# Patient Record
Sex: Male | Born: 1980 | Race: White | Hispanic: No | Marital: Single | Smoking: Former smoker
Health system: Southern US, Community
[De-identification: ages and names within clinical notes are randomized; demographics above are authoritative.]

## PROBLEM LIST (undated history)

## (undated) DIAGNOSIS — F32A Depression, unspecified: Secondary | ICD-10-CM

## (undated) DIAGNOSIS — F419 Anxiety disorder, unspecified: Secondary | ICD-10-CM

## (undated) DIAGNOSIS — E119 Type 2 diabetes mellitus without complications: Secondary | ICD-10-CM

## (undated) DIAGNOSIS — F329 Major depressive disorder, single episode, unspecified: Secondary | ICD-10-CM

## (undated) HISTORY — PX: APPENDECTOMY: SHX54

---

## 2015-05-14 DIAGNOSIS — E119 Type 2 diabetes mellitus without complications: Secondary | ICD-10-CM

## 2015-05-14 HISTORY — DX: Type 2 diabetes mellitus without complications: E11.9

## 2015-05-29 ENCOUNTER — Emergency Department (HOSPITAL_BASED_OUTPATIENT_CLINIC_OR_DEPARTMENT_OTHER): Payer: BC Managed Care – PPO

## 2015-05-29 ENCOUNTER — Other Ambulatory Visit: Payer: Self-pay

## 2015-05-29 ENCOUNTER — Inpatient Hospital Stay (HOSPITAL_BASED_OUTPATIENT_CLINIC_OR_DEPARTMENT_OTHER)
Admission: EM | Admit: 2015-05-29 | Discharge: 2015-05-30 | DRG: 638 | Disposition: A | Discharge status: Home / Self Care | Payer: BC Managed Care – PPO | Attending: Internal Medicine | Admitting: Internal Medicine

## 2015-05-29 ENCOUNTER — Encounter (HOSPITAL_BASED_OUTPATIENT_CLINIC_OR_DEPARTMENT_OTHER): Payer: Self-pay

## 2015-05-29 DIAGNOSIS — E1101 Type 2 diabetes mellitus with hyperosmolarity with coma: Secondary | ICD-10-CM | POA: Diagnosis present

## 2015-05-29 DIAGNOSIS — E7251 Non-ketotic hyperglycinemia: Secondary | ICD-10-CM | POA: Diagnosis present

## 2015-05-29 DIAGNOSIS — Z6821 Body mass index (BMI) 21.0-21.9, adult: Secondary | ICD-10-CM

## 2015-05-29 DIAGNOSIS — F17210 Nicotine dependence, cigarettes, uncomplicated: Secondary | ICD-10-CM | POA: Diagnosis present

## 2015-05-29 DIAGNOSIS — E1165 Type 2 diabetes mellitus with hyperglycemia: Secondary | ICD-10-CM | POA: Diagnosis present

## 2015-05-29 DIAGNOSIS — E161 Other hypoglycemia: Secondary | ICD-10-CM | POA: Diagnosis present

## 2015-05-29 DIAGNOSIS — E871 Hypo-osmolality and hyponatremia: Secondary | ICD-10-CM | POA: Diagnosis present

## 2015-05-29 DIAGNOSIS — E8889 Other specified metabolic disorders: Secondary | ICD-10-CM | POA: Diagnosis present

## 2015-05-29 DIAGNOSIS — E162 Hypoglycemia, unspecified: Secondary | ICD-10-CM | POA: Diagnosis present

## 2015-05-29 DIAGNOSIS — R739 Hyperglycemia, unspecified: Secondary | ICD-10-CM

## 2015-05-29 HISTORY — DX: Anxiety disorder, unspecified: F41.9

## 2015-05-29 HISTORY — DX: Depression, unspecified: F32.A

## 2015-05-29 HISTORY — DX: Type 2 diabetes mellitus without complications: E11.9

## 2015-05-29 HISTORY — DX: Major depressive disorder, single episode, unspecified: F32.9

## 2015-05-29 LAB — CBC
HEMATOCRIT: 42.5 % (ref 39.0–52.0)
HEMATOCRIT: 37 % — AB (ref 39.0–52.0)
HEMOGLOBIN: 15.2 g/dL (ref 13.0–17.0)
Hemoglobin: 13.5 g/dL (ref 13.0–17.0)
MCH: 32.3 pg (ref 26.0–34.0)
MCH: 33 pg (ref 26.0–34.0)
MCHC: 35.8 g/dL (ref 30.0–36.0)
MCHC: 36.5 g/dL — AB (ref 30.0–36.0)
MCV: 90.4 fL (ref 78.0–100.0)
MCV: 90.5 fL (ref 78.0–100.0)
Platelets: 189 10*3/uL (ref 150–400)
Platelets: 174 10*3/uL (ref 150–400)
RBC: 4.7 MIL/uL (ref 4.22–5.81)
RBC: 4.09 MIL/uL — ABNORMAL LOW (ref 4.22–5.81)
RDW: 11.6 % (ref 11.5–15.5)
RDW: 11.8 % (ref 11.5–15.5)
WBC: 4.7 10*3/uL (ref 4.0–10.5)
WBC: 4.1 10*3/uL (ref 4.0–10.5)

## 2015-05-29 LAB — URINE MICROSCOPIC-ADD ON
Bacteria, UA: NONE SEEN
SQUAMOUS EPITHELIAL / LPF: NONE SEEN
WBC UA: NONE SEEN WBC/hpf (ref 0–5)

## 2015-05-29 LAB — GLUCOSE, CAPILLARY
GLUCOSE-CAPILLARY: 159 mg/dL — AB (ref 65–99)
GLUCOSE-CAPILLARY: 309 mg/dL — AB (ref 65–99)
Glucose-Capillary: 144 mg/dL — ABNORMAL HIGH (ref 65–99)
Glucose-Capillary: 304 mg/dL — ABNORMAL HIGH (ref 65–99)

## 2015-05-29 LAB — BASIC METABOLIC PANEL
ANION GAP: 14 (ref 5–15)
Anion gap: 10 (ref 5–15)
BUN: 9 mg/dL (ref 6–20)
BUN: 7 mg/dL (ref 6–20)
CALCIUM: 8.6 mg/dL — AB (ref 8.9–10.3)
CHLORIDE: 93 mmol/L — AB (ref 101–111)
CO2: 22 mmol/L (ref 22–32)
CO2: 25 mmol/L (ref 22–32)
CREATININE: 0.62 mg/dL (ref 0.61–1.24)
Calcium: 9.5 mg/dL (ref 8.9–10.3)
Chloride: 104 mmol/L (ref 101–111)
Creatinine, Ser: 0.67 mg/dL (ref 0.61–1.24)
GFR calc non Af Amer: >60 mL/min (ref >60)
Glucose, Bld: 542 mg/dL — ABNORMAL HIGH (ref 65–99)
Glucose, Bld: 169 mg/dL — ABNORMAL HIGH (ref 65–99)
POTASSIUM: 4.1 mmol/L (ref 3.5–5.1)
Potassium: 3.2 mmol/L — ABNORMAL LOW (ref 3.5–5.1)
SODIUM: 129 mmol/L — AB (ref 135–145)
Sodium: 139 mmol/L (ref 135–145)

## 2015-05-29 LAB — CBG MONITORING, ED
GLUCOSE-CAPILLARY: 283 mg/dL — AB (ref 65–99)
GLUCOSE-CAPILLARY: 246 mg/dL — AB (ref 65–99)
Glucose-Capillary: 561 mg/dL (ref 65–99)
Glucose-Capillary: 298 mg/dL — ABNORMAL HIGH (ref 65–99)

## 2015-05-29 LAB — URINALYSIS, ROUTINE W REFLEX MICROSCOPIC
BILIRUBIN URINE: NEGATIVE
Glucose, UA: >1000 mg/dL — AB
Hgb urine dipstick: NEGATIVE
KETONES UR: 15 mg/dL — AB
LEUKOCYTES UA: NEGATIVE
NITRITE: NEGATIVE
Protein, ur: NEGATIVE mg/dL
SPECIFIC GRAVITY, URINE: 1.038 — AB (ref 1.005–1.030)
pH: 6 (ref 5.0–8.0)

## 2015-05-29 LAB — TROPONIN I: Troponin I: <0.03 ng/mL (ref <0.031)

## 2015-05-29 MED ORDER — PNEUMOCOCCAL VAC POLYVALENT 25 MCG/0.5ML IJ INJ
0.5000 mL | INJECTION | INTRAMUSCULAR | Status: AC
Start: 2015-05-30 — End: 2015-05-30
  Administered 2015-05-30: 0.5 mL via INTRAMUSCULAR
  Filled 2015-05-29: qty 0.5

## 2015-05-29 MED ORDER — SODIUM CHLORIDE 0.9 % IV SOLN: INTRAVENOUS | Status: DC

## 2015-05-29 MED ORDER — INFLUENZA VAC SPLIT QUAD 0.5 ML IM SUSY
0.5000 mL | PREFILLED_SYRINGE | INTRAMUSCULAR | Status: AC
  Administered 2015-05-30: 0.5 mL via INTRAMUSCULAR
  Filled 2015-05-29: qty 0.5

## 2015-05-29 MED ORDER — ONDANSETRON HCL 4 MG/2ML IJ SOLN: 4.0000 mg | Freq: Four times a day (QID) | INTRAMUSCULAR | Status: DC | PRN

## 2015-05-29 MED ORDER — SODIUM CHLORIDE 0.9 % IV BOLUS (SEPSIS): 1000.0000 mL | Freq: Once | INTRAVENOUS | Status: DC

## 2015-05-29 MED ORDER — SODIUM CHLORIDE 0.9 % IV SOLN
INTRAVENOUS | Status: AC
  Administered 2015-05-29: 2 [IU]/h via INTRAVENOUS
  Administered 2015-05-29: 1.7 [IU]/h via INTRAVENOUS
  Administered 2015-05-29: 3.7 [IU]/h via INTRAVENOUS
  Administered 2015-05-29: 5 [IU]/h via INTRAVENOUS
  Filled 2015-05-29: qty 2.5

## 2015-05-29 MED ORDER — INSULIN ASPART 100 UNIT/ML ~~LOC~~ SOLN
3.0000 [IU] | Freq: Three times a day (TID) | SUBCUTANEOUS | Status: DC
Start: 2015-05-30 — End: 2015-05-30
  Administered 2015-05-30 (×2): 3 [IU] via SUBCUTANEOUS

## 2015-05-29 MED ORDER — HEPARIN SODIUM (PORCINE) 5000 UNIT/ML IJ SOLN
5000.0000 [IU] | Freq: Three times a day (TID) | INTRAMUSCULAR | Status: DC
  Administered 2015-05-29 – 2015-05-30 (×2): 5000 [IU] via SUBCUTANEOUS
  Filled 2015-05-29 (×2): qty 1

## 2015-05-29 MED ORDER — DEXTROSE-NACL 5-0.9 % IV SOLN: Freq: Once | INTRAVENOUS | Status: DC

## 2015-05-29 MED ORDER — ENSURE ENLIVE PO LIQD
237.0000 mL | Freq: Two times a day (BID) | ORAL | Status: DC
  Administered 2015-05-30: 237 mL via ORAL

## 2015-05-29 MED ORDER — SODIUM CHLORIDE 0.9 % IV BOLUS (SEPSIS)
2000.0000 mL | Freq: Once | INTRAVENOUS | Status: AC
  Administered 2015-05-29: 2000 mL via INTRAVENOUS

## 2015-05-29 MED ORDER — INSULIN DETEMIR 100 UNIT/ML ~~LOC~~ SOLN
10.0000 [IU] | Freq: Every day | SUBCUTANEOUS | Status: DC
Start: 2015-05-29 — End: 2015-05-30
  Administered 2015-05-29: 10 [IU] via SUBCUTANEOUS
  Filled 2015-05-29: qty 0.1

## 2015-05-29 MED ORDER — DEXTROSE-NACL 5-0.45 % IV SOLN
INTRAVENOUS | Status: DC
  Administered 2015-05-29: 15:00:00 via INTRAVENOUS

## 2015-05-29 MED ORDER — SODIUM CHLORIDE 0.9 % IV BOLUS (SEPSIS)
1000.0000 mL | Freq: Once | INTRAVENOUS | Status: AC
  Administered 2015-05-29: 1000 mL via INTRAVENOUS

## 2015-05-29 MED ORDER — ACETAMINOPHEN 325 MG PO TABS
650.0000 mg | ORAL_TABLET | Freq: Four times a day (QID) | ORAL | Status: DC | PRN
  Filled 2015-05-29: qty 2

## 2015-05-29 MED ORDER — ACETAMINOPHEN 650 MG RE SUPP: 650.0000 mg | Freq: Four times a day (QID) | RECTAL | Status: DC | PRN

## 2015-05-29 MED ORDER — INSULIN DETEMIR 100 UNIT/ML ~~LOC~~ SOLN
20.0000 [IU] | Freq: Every day | SUBCUTANEOUS | Status: DC
Start: 2015-05-29 — End: 2015-05-29
  Filled 2015-05-29: qty 0.2

## 2015-05-29 MED ORDER — INSULIN ASPART 100 UNIT/ML ~~LOC~~ SOLN
0.0000 [IU] | Freq: Three times a day (TID) | SUBCUTANEOUS | Status: DC
  Administered 2015-05-30: 3 [IU] via SUBCUTANEOUS
  Administered 2015-05-30: 7 [IU] via SUBCUTANEOUS

## 2015-05-29 MED ORDER — ONDANSETRON HCL 4 MG PO TABS: 4.0000 mg | ORAL_TABLET | Freq: Four times a day (QID) | ORAL | Status: DC | PRN

## 2015-05-29 MED ORDER — INSULIN ASPART 100 UNIT/ML ~~LOC~~ SOLN
0.0000 [IU] | Freq: Every day | SUBCUTANEOUS | Status: DC
  Administered 2015-05-29: 4 [IU] via SUBCUTANEOUS

## 2015-05-29 NOTE — ED Notes (Signed)
Presents with mid sternal, non-radiating chest pain, described as pressure, onset a few days ago, states pain is intermittent

## 2015-05-29 NOTE — ED Notes (Signed)
Pa  at bedside. 

## 2015-05-29 NOTE — ED Notes (Signed)
1 liter NS fluid bolus initiated

## 2015-05-29 NOTE — Progress Notes (Signed)
Greg Goodman [id number] Admission Data: 05/29/2015 5:51 PM Attending Provider: Marinda Elk, MD  PCP:No PCP Per Patient Consults/ Treatment Team:    Greg Goodman is a 35 y.o. male patient admitted from [facility] awake, alert  & orientated  X 3,  Full Code, VSS - Blood pressure 133/89, pulse 86, temperature 98.7 F (37.1 C), temperature source Oral, resp. rate 18, height  (1.854 m), weight 68.4 kg (150 lb 12.7 oz), SpO2 99 %.,   IV site WDL:  Running multiple fluids.   Allergies:  No Known Allergies   History reviewed. No pertinent past medical history.   Pt orientation to unit, room and routine. Information packet given to patient/family and safety video watched.  Admission INP armband ID verified with patient/family, and in place. SR up x 2, fall risk assessment complete with Patient and family verbalizing understanding of risks associated with falls. Pt verbalizes an understanding of how to use the call bell and to call for help before getting out of bed.  Skin, clean-dry- intact without evidence of bruising, or skin tears.   No evidence of skin break down noted on exam.     Will cont to monitor and assist as needed.  Kern Reap, RN 05/29/2015 5:51 PM

## 2015-05-29 NOTE — ED Provider Notes (Signed)
CSN: [id number]     Arrival date & time 05/29/15  1214 History   First MD Initiated Contact with Patient 05/29/15 1321     Chief Complaint  Patient presents with  . Chest Pain     (Consider location/radiation/quality/duration/timing/severity/associated sxs/prior Treatment) HPI  Greg Goodman Is a 35 year old male who presents the emergency department from an urgent care facility for hyperglycemia. The patient presented to the urgent care earlier because of central chest discomfort feeling like "something just wasn't right."  The patient has had intermittent feelings of retrosternal pressure for the past several months. He states it can last minutes to up to an hour. He has some occasional shortness of breath. It doesn't seem to be exertional or positional. He denies chest pain, symptoms of reflux, nausea or vomiting. The patient has had polyuria, polydipsia and polyphagia ongoing for almost a year. He has had an unintentional weight loss of more than 30 pounds. Patient did see a primary care physician. Motor a year ago who told him he was prediabetic. At that time. Today it patient presented with a blood sugar of almost 600. He is complaining of intermittent pruritic tenderness, which she attributed to his cirrhosis during the winter. Cough, pnd, or orthopnea  History reviewed. No pertinent past medical history. Past Surgical History  Procedure Laterality Date  . Appendectomy     No family history on file. Social History  Substance Use Topics  . Smoking status: Current Every Day Smoker  . Smokeless tobacco: None  . Alcohol Use: Yes     Comment: occ    Review of Systems  Ten systems reviewed and are negative for acute change, except as noted in the HPI.    Allergies  Review of patient's allergies indicates no known allergies.  Home Medications   Prior to Admission medications   Not on File   BP 144/98 mmHg  Pulse 108  Temp(Src) 98 F (36.7 C) (Oral)  Resp 16  Ht   (1.854 m)  Wt 72.576 kg  BMI 21.11 kg/m2  SpO2 100% Physical Exam  Constitutional: He appears well-developed and well-nourished. No distress.  HENT:  Head: Normocephalic and atraumatic.  xerostomia  Eyes: Conjunctivae are normal. No scleral icterus.  Neck: Normal range of motion. Neck supple.  Cardiovascular: Normal rate, regular rhythm and normal heart sounds.   Pulmonary/Chest: Effort normal and breath sounds normal. No respiratory distress.  Abdominal: Soft. There is no tenderness.  Musculoskeletal: He exhibits no edema.  Neurological: He is alert.  Skin: Skin is warm and dry. He is not diaphoretic.  Poor skin turgor  Psychiatric: His behavior is normal.  Nursing note and vitals reviewed.   ED Course  Procedures (including critical care time) Labs Review Labs Reviewed  BASIC METABOLIC PANEL - Abnormal; Notable for the following:    Sodium 129 (*)    Chloride 93 (*)    Glucose, Bld 542 (*)    All other components within normal limits  URINALYSIS, ROUTINE W REFLEX MICROSCOPIC (NOT AT [facility]) - Abnormal; Notable for the following:    Specific Gravity, Urine 1.038 (*)    Glucose, UA >1000 (*)    Ketones, ur 15 (*)    All other components within normal limits  CBG MONITORING, ED - Abnormal; Notable for the following:    Glucose-Capillary 561 (*)    All other components within normal limits  CBC  TROPONIN I  URINE MICROSCOPIC-ADD ON    Imaging Review Dg Chest 2 View  05/29/2015  CLINICAL DATA:  Intermittent chest pain for 2 days, elevated blood sugar, smoker EXAM: CHEST  2 VIEW COMPARISON:  None FINDINGS: Normal heart size, mediastinal contours, and pulmonary vascularity. Lungs clear. No pneumothorax. Bones unremarkable. IMPRESSION: Normal exam. Electronically Signed   By: Ulyses Southward M.D.   On: 05/29/2015 13:36   I have personally reviewed and evaluated these images and lab results as part of my medical decision-making.   EKG Interpretation None      MDM   Final  diagnoses:  Non-ketotic hypoglycemia      4:04 PM BP 135/96 mmHg  Pulse 92  Temp(Src) 98 F (36.7 C) (Oral)  Resp 19  Ht  (1.854 m)  Wt 72.576 kg  BMI 21.11 kg/m2  SpO2 100% Patient with new onset diabetes. He is on insulin drip with drastic reduction in his blood sugars. I have also ordered D5 and half saline. I spoke with Dr. Zenaida Niece, who will admit the patient. I also spoke with the diabetic coordinator who will see the patient. Decision to admit. Based on the fact the patient does not have a primary care physician and is a new diabetic. He is safe for his course here in the emergency department.    Arthor Captain, PA-C 05/29/15 1718  Geoffery Lyons, MD 06/01/15 [phone]

## 2015-05-29 NOTE — Progress Notes (Signed)
Patient found to have new onset DM-- has no PCP and no diabetic education.  Does have insurance.  Will obs to find the right diabetic medications and get education with diabetic coordinator  Marlin Canary DO

## 2015-05-29 NOTE — ED Notes (Signed)
Placed on cont cardiac monitoring 

## 2015-05-29 NOTE — ED Notes (Signed)
CP x 3- 4 days-was seen at urgent care today and advised BS "HIGH"

## 2015-05-29 NOTE — ED Notes (Signed)
Patient transported to X-ray 

## 2015-05-29 NOTE — H&P (Signed)
[facility] History and Physical  Roque Schill KZS:[id number] DOB: 1981/03/24 DOA: 05/29/2015  Referring physician: Dr. Eliseo Squires PCP: No PCP Per Patient   Chief Complaint: chest discomfort  HPI: Greg Goodman is a 35 y.o. male with no significant past medical history and severe June care for not feeling well blood glucose was done as that time and he was found to be hyperglycemic. He relates he's had intermittent chest pain for the last several weeks the last about an hour and then go away. He denies any exertional dyspnea or chest pain non-positional chest pain. He relates he's had polyuria and polydipsia for last several months, with unintentional weight loss he relates more than 30 pounds in the last 6 months. On the visit to urgent care his blood glucose was 600 so he was referred for admission. He has no new medication so culture sores no large consumptions of alcohol or recreational drugs.  In the ED: Is found to have with a blood look O's of 561, mildly hyponatremic no leukocytosis first of cardiac enzymes negative an EKG as below. He had urine ketones.  Review of Systems:  Constitutional:   night sweats, Fevers, chills, fatigue.  HEENT:  No headaches, Difficulty swallowing,Tooth/dental problems,Sore throat,  No sneezing, itching, ear ache, nasal congestion, post nasal drip,  Cardio-vascular:  No chest pain, Orthopnea, PND, swelling in lower extremities, anasarca, dizziness, palpitations  GI:  No heartburn, indigestion, abdominal pain, nausea, vomiting, diarrhea, change in bowel habits, loss of appetite  Resp:  No shortness of breath with exertion or at rest. No excess mucus, no productive cough, No non-productive cough, No coughing up of blood.No change in color of mucus.No wheezing.No chest wall deformity  Skin:  no rash or lesions.  GU:  no dysuria, change in color of urine, no urgency or frequency. No flank pain.  Musculoskeletal:  No joint pain or swelling. No decreased  range of motion. No back pain.  Psych:  No change in mood or affect. No depression or anxiety. No memory loss.   History reviewed. No pertinent past medical history. Past Surgical History  Procedure Laterality Date  . Appendectomy     Social History:  reports that he has been smoking Cigarettes.  He has been smoking about 0.25 packs per day. He does not have any smokeless tobacco history on file. He reports that he drinks alcohol. He reports that he does not use illicit drugs.  No Known Allergies  History reviewed. No pertinent family history.  He relates that his mother or father have no medical condition.  Prior to Admission medications   Not on File   Physical Exam: Filed Vitals:   05/29/15 1500 05/29/15 1515 05/29/15 1524 05/29/15 1659  BP:   135/96 133/89  Pulse: 85 86 92 86  Temp:    98.7 F (37.1 C)  TempSrc:    Oral  Resp: '18 14 19 18  ' Height:    '6\' 1"'  (1.854 m)  Weight:    68.4 kg (150 lb 12.7 oz)  SpO2: 100% 100% 100% 99%    Wt Readings from Last 3 Encounters:  05/29/15 68.4 kg (150 lb 12.7 oz)    General:  Appears calm and comfortable Eyes: PERRL, normal lids, irises & conjunctiva ENT: grossly normal hearing, lips & tongue Neck: no LAD, masses or thyromegaly Cardiovascular: RRR, no m/r/g. No LE edema. Telemetry: SR, no arrhythmias  Respiratory: CTA bilaterally, no w/r/r. Normal respiratory effort. Abdomen: soft, ntnd Skin: no rash or induration seen on limited  exam Musculoskeletal: grossly normal tone BUE/BLE Psychiatric: grossly normal mood and affect, speech fluent and appropriate Neurologic: grossly non-focal.          Labs on Admission:  Basic Metabolic Panel:  Recent Labs Lab 05/29/15 1250  NA 129*  K 4.1  CL 93*  CO2 22  GLUCOSE 542*  BUN 9  CREATININE 0.67  CALCIUM 9.5   Liver Function Tests: No results for input(s): AST, ALT, ALKPHOS, BILITOT, PROT, ALBUMIN in the last 168 hours. No results for input(s): LIPASE, AMYLASE in the  last 168 hours. No results for input(s): AMMONIA in the last 168 hours. CBC:  Recent Labs Lab 05/29/15 1250  WBC 4.7  HGB 15.2  HCT 42.5  MCV 90.4  PLT 189   Cardiac Enzymes:  Recent Labs Lab 05/29/15 1250  TROPONINI <0.03    BNP (last 3 results) No results for input(s): BNP in the last 8760 hours.  ProBNP (last 3 results) No results for input(s): PROBNP in the last 8760 hours.  CBG:  Recent Labs Lab 05/29/15 1229 05/29/15 1441 05/29/15 1511 05/29/15 1538  GLUCAP 561* 298* 283* 246*    Radiological Exams on Admission: Dg Chest 2 View  05/29/2015  CLINICAL DATA:  Intermittent chest pain for 2 days, elevated blood sugar, smoker EXAM: CHEST  2 VIEW COMPARISON:  None FINDINGS: Normal heart size, mediastinal contours, and pulmonary vascularity. Lungs clear. No pneumothorax. Bones unremarkable. IMPRESSION: Normal exam. Electronically Signed   By: Lavonia Dana M.D.   On: 05/29/2015 13:36    EKG: Independently reviewed. Normal sinus or rhythm intervals were normal axis is normal no T wave changes.  Assessment/Plan Active Problems:   Nonketotic hyperglycinemia (HCC)   Hyponatremia  I agree with IV insulin, his blood glucose is now less than 200, we'll start him on subcutaneous insulin for sliding scale insulin. Check a hemoglobin A1c and an anti-GAD antibody. We'll given to liter bolus additionally of normal saline. Allow  diet. Check a b-met now and in am. Cont CBG's ACHS. Likely newly diagnosed diabetic, check a UDS he relates he doesn't drink alcohol check LFTs. No new drugs or steroids.   Code Status: full DVT Prophylaxis:heparin Family Communication: none Disposition Plan: inpatient  Time spent: 65 min  Charlynne Cousins [facility] Pager [phone]

## 2015-05-30 DIAGNOSIS — E7251 Non-ketotic hyperglycinemia: Secondary | ICD-10-CM

## 2015-05-30 LAB — CBC
HCT: 37.5 % — ABNORMAL LOW (ref 39.0–52.0)
HEMOGLOBIN: 13.5 g/dL (ref 13.0–17.0)
MCH: 33.3 pg (ref 26.0–34.0)
MCHC: 36 g/dL (ref 30.0–36.0)
MCV: 92.6 fL (ref 78.0–100.0)
Platelets: 152 10*3/uL (ref 150–400)
RBC: 4.05 MIL/uL — ABNORMAL LOW (ref 4.22–5.81)
RDW: 11.9 % (ref 11.5–15.5)
WBC: 3.3 10*3/uL — ABNORMAL LOW (ref 4.0–10.5)

## 2015-05-30 LAB — COMPREHENSIVE METABOLIC PANEL
ALBUMIN: 3.3 g/dL — AB (ref 3.5–5.0)
ALK PHOS: 55 U/L (ref 38–126)
ALT: 21 U/L (ref 17–63)
ANION GAP: 8 (ref 5–15)
AST: 24 U/L (ref 15–41)
BILIRUBIN TOTAL: 0.4 mg/dL (ref 0.3–1.2)
BUN: 6 mg/dL (ref 6–20)
CALCIUM: 8.5 mg/dL — AB (ref 8.9–10.3)
CO2: 23 mmol/L (ref 22–32)
Chloride: 104 mmol/L (ref 101–111)
Creatinine, Ser: 0.66 mg/dL (ref 0.61–1.24)
GFR calc Af Amer: >60 mL/min (ref >60)
GFR calc non Af Amer: >60 mL/min (ref >60)
Glucose, Bld: 262 mg/dL — ABNORMAL HIGH (ref 65–99)
Potassium: 3.9 mmol/L (ref 3.5–5.1)
SODIUM: 135 mmol/L (ref 135–145)
TOTAL PROTEIN: 5.3 g/dL — AB (ref 6.5–8.1)

## 2015-05-30 LAB — GLUTAMIC ACID DECARBOXYLASE AUTO ABS: Glutamic Acid Decarb Ab: <5 U/mL (ref 0.0–5.0)

## 2015-05-30 LAB — HEMOGLOBIN A1C
HEMOGLOBIN A1C: 13.3 % — AB (ref 4.8–5.6)
MEAN PLASMA GLUCOSE: 335 mg/dL

## 2015-05-30 LAB — GLUCOSE, CAPILLARY
Glucose-Capillary: 258 mg/dL — ABNORMAL HIGH (ref 65–99)
Glucose-Capillary: 225 mg/dL — ABNORMAL HIGH (ref 65–99)
Glucose-Capillary: 352 mg/dL — ABNORMAL HIGH (ref 65–99)

## 2015-05-30 MED ORDER — POTASSIUM CHLORIDE CRYS ER 20 MEQ PO TBCR
40.0000 meq | EXTENDED_RELEASE_TABLET | Freq: Two times a day (BID) | ORAL | Status: DC
  Administered 2015-05-30: 40 meq via ORAL
  Filled 2015-05-30: qty 2

## 2015-05-30 MED ORDER — INSULIN DETEMIR 100 UNIT/ML FLEXPEN: 30.0000 [IU] | PEN_INJECTOR | Freq: Every day | SUBCUTANEOUS | Status: DC

## 2015-05-30 MED ORDER — INSULIN DETEMIR 100 UNIT/ML ~~LOC~~ SOLN
15.0000 [IU] | Freq: Two times a day (BID) | SUBCUTANEOUS | Status: DC
  Administered 2015-05-30: 15 [IU] via SUBCUTANEOUS
  Filled 2015-05-30 (×2): qty 0.15

## 2015-05-30 MED ORDER — INSULIN PEN NEEDLE 31G X 6 MM MISC: 1.0000 | Freq: Two times a day (BID) | Status: DC

## 2015-05-30 MED ORDER — INSULIN ASPART 100 UNIT/ML FLEXPEN: 4.0000 [IU] | PEN_INJECTOR | Freq: Three times a day (TID) | SUBCUTANEOUS | Status: DC

## 2015-05-30 MED ORDER — INSULIN ASPART 100 UNIT/ML ~~LOC~~ SOLN
4.0000 [IU] | Freq: Once | SUBCUTANEOUS | Status: AC
  Administered 2015-05-30: 4 [IU] via SUBCUTANEOUS

## 2015-05-30 MED ORDER — INSULIN DETEMIR 100 UNIT/ML FLEXPEN: 40.0000 [IU] | PEN_INJECTOR | Freq: Every day | SUBCUTANEOUS | Status: DC

## 2015-05-30 MED ORDER — LIVING WELL WITH DIABETES BOOK
Freq: Once | Status: AC
  Administered 2015-05-30: 13:00:00
  Filled 2015-05-30: qty 1

## 2015-05-30 MED ORDER — INSULIN STARTER KIT- PEN NEEDLES (ENGLISH)
1.0000 | Freq: Once | Status: AC
  Administered 2015-05-30: 1
  Filled 2015-05-30: qty 1

## 2015-05-30 MED ORDER — GLUCOSE BLOOD VI STRP: ORAL_STRIP | Status: DC

## 2015-05-30 NOTE — Plan of Care (Signed)
Problem: Food- and Nutrition-Related Knowledge Deficit (NB-1.1) Goal: Nutrition education Formal process to instruct or train a patient/client in a skill or to impart knowledge to help patients/clients voluntarily manage or modify food choices and eating behavior to maintain or improve health. Outcome: Adequate for Discharge  RD consulted for nutrition education regarding diabetes.     Lab Results  Component Value Date    HGBA1C 13.3* 05/29/2015    Spoke with RN, who reveals that pt is being worked up for type 1 vs type 2 DM. Plan to d/c home once all education is completed.  Spoke with pt at bedside, who reports he generally consumes 2-3 meals per day. He has been watching the DM education videos and is able to teach back this RD sources of carbohydrate, servings sizes, signs/symptoms of hypoglycemia, as well as hypoglycemia correction. He verbalizes plan to establish to PCP and follow-up with outpatient DM education.   RD provided "Carbohydrate Counting for People with Diabetes" handout from the Academy of [employer] and Dietetics. Discussed different food groups and their effects on blood sugar, emphasizing carbohydrate-containing foods. Provided list of carbohydrates and recommended serving sizes of common foods.  Discussed importance of controlled and consistent carbohydrate intake throughout the day. Provided examples of ways to balance meals/snacks and encouraged intake of high-fiber, whole grain complex carbohydrates. Teach back method used.  Expect good compliance.  Body mass index is Body mass index is 19.9 kg/(m^2).Marland Kitchen Pt meets criteria for normal weight range based on current BMI.  Current diet order is Carb Modified, patient is consuming approximately 100of meals at this time. Labs and medications reviewed. No further nutrition interventions warranted at this time. RD contact information provided. If additional nutrition issues arise, please re-consult RD.  Wyndham Santilli A. Mayford Knife,  RD, LDN, CDE Pager: [phone] After hours Pager: [phone]

## 2015-05-30 NOTE — Progress Notes (Signed)
Nsg Discharge Note  Admit Date:  05/29/2015 Discharge date: 05/30/2015   Ala Bent to be D/C'd Home per MD order.  AVS completed.  Copy for chart, and copy for patient signed, and dated. Patient/caregiver able to verbalize understanding.  Discharge Medication:   Medication List    TAKE these medications        glucose blood test strip  Use as instructed     ibuprofen 400 MG tablet  Commonly known as:  ADVIL,MOTRIN  Take 400 mg by mouth every 6 (six) hours as needed for mild pain.     insulin aspart 100 UNIT/ML FlexPen  Commonly known as:  NOVOLOG  Inject 4 Units into the skin 3 (three) times daily with meals.     Insulin Detemir 100 UNIT/ML Pen  Commonly known as:  LEVEMIR  Inject 30 Units into the skin at bedtime.     Insulin Pen Needle 31G X 6 MM Misc  1 Device by Does not apply route 2 (two) times daily.        Discharge Assessment: Filed Vitals:   05/29/15 2212 05/30/15 0534  BP: 125/77 121/88  Pulse: 87 80  Temp: 98.9 F (37.2 C) 97.8 F (36.6 C)  Resp: 16 16   Skin clean, dry and intact without evidence of skin break down, no evidence of skin tears noted. IV catheter discontinued intact. Site without signs and symptoms of complications - no redness or edema noted at insertion site, patient denies c/o pain - only slight tenderness at site.  Dressing with slight pressure applied.  D/c Instructions-Education: Discharge instructions given to patient/family with verbalized understanding. D/c education completed with patient/family including follow up instructions, medication list, d/c activities limitations if indicated, with other d/c instructions as indicated by MD - patient able to verbalize understanding, all questions fully answered. Patient instructed to return to ED, call 911, or call MD for any changes in condition.  Patient escorted via WC, and D/C home via private auto.  Camillo Flaming, RN 05/30/2015 3:45 PM

## 2015-05-30 NOTE — Discharge Summary (Signed)
Physician Discharge Summary  Greg Goodman ZOX:[id number] DOB: 01-14-1981 DOA: 05/29/2015  PCP: No PCP Per Patient  Admit date: 05/29/2015 Discharge date: 05/30/2015  Time spent: 35 minutes  Recommendations for Outpatient Follow-up:  1. Follow-up with Dr. Noralyn Pick in 2-4 weeks to check CBGs before meals and at bedtime titrate insulin as needed. Also check on anti-GAD antibody results.   Discharge Diagnoses:  Active Problems:   Nonketotic hyperglycinemia (HCC)   Hyponatremia   Hyperglycemic hyperosmolar nonketotic coma (HCC)   Discharge Condition: stable   Diet recommendation: carb modified  Filed Weights   05/29/15 1221 05/29/15 1659  Weight: 72.576 kg (160 lb) 68.4 kg (150 lb 12.7 oz)    History of present illness:  35 y.o. male with no significant past medical history and severe June care for not feeling well blood glucose was done as that time and he was found to be hyperglycemic. He relates he's had intermittent chest pain for the last several weeks the last about an hour and then go away. He denies any exertional dyspnea or chest pain non-positional chest pain. He relates he's had polyuria and polydipsia for last several months, with unintentional weight loss he relates more than 30 pounds in the last 6 months. On the visit to urgent care his blood glucose was 600 so he was referred for admission. He has no new medication so culture sores no large consumptions of alcohol or recreational drugs.  Hospital Course:  Hyperglycemia nonketotic state/hypernatremia: On admission his anion gap was 15 his blood glucose was 560. Urine positive for ketone serum osmolarity was 291, his bicarbonate was 23. He related he's been having polyuria and polydipsia for several months with a 20 pound weight loss over the last several months.  Started on IV insulin, CBGs every hour Bumex 24. Positive blood glucose was below 200 he was started on long-acting insulin plus sliding scale. Anti-GAD antibodies  were checked which are pending at the time of this dictation. Hemoglobin A1c was 13.3, he will go home on 30 units of long-acting insulin +4 units of rapid acting insulin with meals.   Procedures:  CXR  Consultations:  none  Discharge Exam: Filed Vitals:   05/29/15 2212 05/30/15 0534  BP: 125/77 121/88  Pulse: 87 80  Temp: 98.9 F (37.2 C) 97.8 F (36.6 C)  Resp: 16 16    General: A&O x3 Cardiovascular: RRR Respiratory: good air movement CTA B/L  Discharge Instructions   Discharge Instructions    Diet - low sodium heart healthy    Complete by:  As directed      For home use only DME Glucometer    Complete by:  As directed      Increase activity slowly    Complete by:  As directed           Current Discharge Medication List    START taking these medications   Details  glucose blood test strip Use as instructed Qty: 100 each, Refills: 12    insulin aspart (NOVOLOG) 100 UNIT/ML FlexPen Inject 4 Units into the skin 3 (three) times daily with meals. Qty: 15 mL, Refills: 11    Insulin Detemir (LEVEMIR) 100 UNIT/ML Pen Inject 30 Units into the skin at bedtime. Qty: 15 mL, Refills: 11    Insulin Pen Needle 31G X 6 MM MISC 1 Device by Does not apply route 2 (two) times daily. Qty: 60 each, Refills: 3      CONTINUE these medications which have NOT CHANGED  Details  ibuprofen (ADVIL,MOTRIN) 400 MG tablet Take 400 mg by mouth every 6 (six) hours as needed for mild pain.       No Known Allergies Follow-up Information    Follow up with Carlus Pavlov, MD In 2 weeks.   Specialty:  Internal Medicine   Why:  hospital follow up   Contact information:   [address] [place] [address] [phone]        The results of significant diagnostics from this hospitalization (including imaging, microbiology, ancillary and laboratory) are listed below for reference.    Significant Diagnostic Studies: Dg Chest 2 View  05/29/2015   CLINICAL DATA:  Intermittent chest pain for 2 days, elevated blood sugar, smoker EXAM: CHEST  2 VIEW COMPARISON:  None FINDINGS: Normal heart size, mediastinal contours, and pulmonary vascularity. Lungs clear. No pneumothorax. Bones unremarkable. IMPRESSION: Normal exam. Electronically Signed   By: Ulyses Southward M.D.   On: 05/29/2015 13:36    Microbiology: No results found for this or any previous visit (from the past 240 hour(s)).   Labs: Basic Metabolic Panel:  Recent Labs Lab 05/29/15 1250 05/29/15 1830 05/30/15 0613  NA 129* 139 135  K 4.1 3.2* 3.9  CL 93* 104 104  CO2 GLUCOSE 542* 169* 262*  BUN CREATININE 0.67 0.62 0.66  CALCIUM 9.5 8.6* 8.5*   Liver Function Tests:  Recent Labs Lab 05/30/15 0613  AST 24  ALT 21  ALKPHOS 55  BILITOT 0.4  PROT 5.3*  ALBUMIN 3.3*   No results for input(s): LIPASE, AMYLASE in the last 168 hours. No results for input(s): AMMONIA in the last 168 hours. CBC:  Recent Labs Lab 05/29/15 1250 05/29/15 1830 05/30/15 0613  WBC 4.7 4.1 3.3*  HGB 15.2 13.5 13.5  HCT 42.5 37.0* 37.5*  MCV 90.4 90.5 92.6  PLT 189 174 152   Cardiac Enzymes:  Recent Labs Lab 05/29/15 1250  TROPONINI <0.03   BNP: BNP (last 3 results) No results for input(s): BNP in the last 8760 hours.  ProBNP (last 3 results) No results for input(s): PROBNP in the last 8760 hours.  CBG:  Recent Labs Lab 05/29/15 1826 05/29/15 2011 05/29/15 2210 05/30/15 0528 05/30/15 0801  GLUCAP 144* 304* 309* 258* 225*     Signed:  Marinda Elk MD.  [facility] 05/30/2015, 9:50 AM

## 2015-05-30 NOTE — Progress Notes (Signed)
Inpatient Diabetes Program Recommendations  AACE/ADA: New Consensus Statement on Inpatient Glycemic Control (2015)  Target Ranges:  Prepandial:   less than 140 mg/dL      Peak postprandial:   less than 180 mg/dL (1-2 hours)      Critically ill patients:  140 - 180 mg/dL   Review of Glycemic Control  Results for Greg Goodman, [name] (MRN [id number]) as of 05/30/2015 12:13  Ref. Range 05/29/2015 18:26 05/29/2015 20:11 05/29/2015 22:10 05/30/2015 05:28 05/30/2015 08:01  Glucose-Capillary Latest Ref Range: 65-99 mg/dL 144 (H) 304 (H) 309 (H) 258 (H) 225 (H)    Diabetes history: new onset, A1C 13.3%   Met with patient regarding new diagnosis; reviewed physiology of diabetes, potential complications, importance of checking blood sugars and documenting them and bringing them to MD visits, how to use a glucometer , the differences in Levemir and Novolog insulin.  I reviewed basis carb counting and reviewed the Living Well with diabetes booklet. The patient has watched a number of the patient education videos and will complete the rest before discharge.   Teach back method used to teach insulin pen- he had no difficulty.  I have asked him to review the living well with diabetes book - specifically treating low blood sugars- please review with him before discharge and be sure he understands insulin doses and timing before discharge.   Will need a family doctor and out patient education which he has asked to delay until he sees a family doctor.  Thank you,   Gentry Fitz, RN, [place], [place], CDE Diabetes Coordinator Inpatient Diabetes Program  [phone] (Team Pager) [phone] ([place]) 05/30/2015 12:33 PM

## 2015-05-30 NOTE — Care Management Note (Signed)
Case Management Note  Patient Details  Name: Greg Goodman MRN: [id number] Date of Birth: Sep 04, 1980  Subjective/Objective:                 Spoke to patient at the bedside. He is a Runner, broadcasting/film/video for [employer]. He lives with roommate. He has state [employer]. He states that he is not linked to a PCP but is going to schedule appointment with Dr Nehemiah Settle himself after discharge, appointment w/ GI in [place]. Spoke about meters and costs. Informed on Reli On brand through [employer] as being cheapest option if one not covered through his insurance. Will DC on Levemir and Novolog.   Action/Plan:  No CM needs at this time.  Expected Discharge Date:                  Expected Discharge Plan:  Home/Self Care  In-House Referral:     Discharge planning Services  CM Consult  Post Acute Care Choice:  NA Choice offered to:     DME Arranged:    DME Agency:     HH Arranged:    HH Agency:     Status of Service:  Completed, signed off  Medicare Important Message Given:    Date Medicare IM Given:    Medicare IM give by:    Date Additional Medicare IM Given:    Additional Medicare Important Message give by:     If discussed at Long Length of Stay Meetings, dates discussed:    Additional Comments:  Lawerance Sabal, RN 05/30/2015, 11:05 AM

## 2015-06-02 ENCOUNTER — Encounter (HOSPITAL_COMMUNITY): Payer: Self-pay | Admitting: *Deleted

## 2015-06-02 ENCOUNTER — Emergency Department (HOSPITAL_COMMUNITY)
Admission: EM | Admit: 2015-06-02 | Discharge: 2015-06-03 | Disposition: A | Discharge status: Home / Self Care | Payer: BC Managed Care – PPO | Attending: Emergency Medicine | Admitting: Emergency Medicine

## 2015-06-02 DIAGNOSIS — E119 Type 2 diabetes mellitus without complications: Secondary | ICD-10-CM | POA: Diagnosis not present

## 2015-06-02 DIAGNOSIS — Z8659 Personal history of other mental and behavioral disorders: Secondary | ICD-10-CM | POA: Insufficient documentation

## 2015-06-02 DIAGNOSIS — E7251 Non-ketotic hyperglycinemia: Secondary | ICD-10-CM | POA: Diagnosis not present

## 2015-06-02 DIAGNOSIS — R739 Hyperglycemia, unspecified: Secondary | ICD-10-CM | POA: Diagnosis present

## 2015-06-02 DIAGNOSIS — Z794 Long term (current) use of insulin: Secondary | ICD-10-CM | POA: Diagnosis not present

## 2015-06-02 DIAGNOSIS — Z87891 Personal history of nicotine dependence: Secondary | ICD-10-CM | POA: Diagnosis not present

## 2015-06-02 LAB — BASIC METABOLIC PANEL
Anion gap: 12 (ref 5–15)
BUN: 14 mg/dL (ref 6–20)
CO2: 28 mmol/L (ref 22–32)
Calcium: 9.5 mg/dL (ref 8.9–10.3)
Chloride: 100 mmol/L — ABNORMAL LOW (ref 101–111)
Creatinine, Ser: 0.9 mg/dL (ref 0.61–1.24)
GFR calc Af Amer: >60 mL/min (ref >60)
GFR calc non Af Amer: >60 mL/min (ref >60)
Glucose, Bld: 250 mg/dL — ABNORMAL HIGH (ref 65–99)
Potassium: 4.1 mmol/L (ref 3.5–5.1)
Sodium: 140 mmol/L (ref 135–145)

## 2015-06-02 LAB — CBC
HCT: 39.8 % (ref 39.0–52.0)
Hemoglobin: 13.8 g/dL (ref 13.0–17.0)
MCH: 32.5 pg (ref 26.0–34.0)
MCHC: 34.7 g/dL (ref 30.0–36.0)
MCV: 93.6 fL (ref 78.0–100.0)
Platelets: 211 10*3/uL (ref 150–400)
RBC: 4.25 MIL/uL (ref 4.22–5.81)
RDW: 11.9 % (ref 11.5–15.5)
WBC: 5.8 10*3/uL (ref 4.0–10.5)

## 2015-06-02 LAB — URINE MICROSCOPIC-ADD ON
Bacteria, UA: NONE SEEN
RBC / HPF: NONE SEEN RBC/hpf (ref 0–5)
Squamous Epithelial / LPF: NONE SEEN
WBC, UA: NONE SEEN WBC/hpf (ref 0–5)

## 2015-06-02 LAB — URINALYSIS, ROUTINE W REFLEX MICROSCOPIC
Bilirubin Urine: NEGATIVE
Glucose, UA: >1000 mg/dL — AB
Hgb urine dipstick: NEGATIVE
Ketones, ur: 15 mg/dL — AB
Leukocytes, UA: NEGATIVE
Nitrite: NEGATIVE
Protein, ur: NEGATIVE mg/dL
Specific Gravity, Urine: 1.023 (ref 1.005–1.030)
pH: 6.5 (ref 5.0–8.0)

## 2015-06-02 LAB — CBG MONITORING, ED: Glucose-Capillary: 224 mg/dL — ABNORMAL HIGH (ref 65–99)

## 2015-06-02 MED ORDER — SODIUM CHLORIDE 0.9 % IV BOLUS (SEPSIS)
1000.0000 mL | Freq: Once | INTRAVENOUS | Status: AC
  Administered 2015-06-02: 1000 mL via INTRAVENOUS

## 2015-06-02 MED ORDER — ACETAMINOPHEN 325 MG PO TABS
650.0000 mg | ORAL_TABLET | Freq: Once | ORAL | Status: AC
  Administered 2015-06-03: 650 mg via ORAL
  Filled 2015-06-02: qty 2

## 2015-06-02 NOTE — ED Provider Notes (Signed)
CSN: [id number]     Arrival date & time 06/02/15  2212 History   First MD Initiated Contact with Patient 06/02/15 2334     Chief Complaint  Patient presents with  . Hyperglycemia     (Consider location/radiation/quality/duration/timing/severity/associated sxs/prior Treatment) HPI   35 year old male with new onset diabetes diagnosed a week ago presenting for evaluation of generalized weakness. Patient reports he was diagnosed with diabetes and spent 2 days in the hospital. He was discharge 5 days ago and felt fine but for the past several days he endorsed having generalized weakness, dizziness, blurred vision, and heart palpitation. Having difficult time walking. He denies room spinning sensation. Endorse a moderate intensity headache for the same duration. Describe headaches as a tightness throbbing sensation across his forehead. No associated vision changes, focal numbness or weakness. No photophobia or phonophobia. He denies having any fever, neck stiffness, or rash. He mentioned that his blood sugar has been fluctuating between 170-360  despite taking his insulin as prescribed. He has not had an established provider yet but will be follow-up with Dr. Conservation officer, historic buildings. He denies history of alcohol abuse of recreational drug use.  Past Medical History  Diagnosis Date  . Diabetes mellitus without complication (HCC) 05/2015    NEW ONSET  . Depression   . Anxiety    Past Surgical History  Procedure Laterality Date  . Appendectomy     No family history on file. Social History  Substance Use Topics  . Smoking status: Former Smoker -- 0.25 packs/day for 0 years  . Smokeless tobacco: Never Used  . Alcohol Use: Yes     Comment: occ    Review of Systems  All other systems reviewed and are negative.     Allergies  Review of patient's allergies indicates no known allergies.  Home Medications   Prior to Admission medications   Medication Sig Start Date End Date Taking? Authorizing Provider   glucose blood test strip Use as instructed 05/30/15   Marinda Elk, MD  ibuprofen (ADVIL,MOTRIN) 400 MG tablet Take 400 mg by mouth every 6 (six) hours as needed for mild pain.    Historical Provider, MD  insulin aspart (NOVOLOG) 100 UNIT/ML FlexPen Inject 4 Units into the skin 3 (three) times daily with meals. 05/30/15   Marinda Elk, MD  Insulin Detemir (LEVEMIR) 100 UNIT/ML Pen Inject 40 Units into the skin at bedtime. 05/30/15   Marinda Elk, MD  Insulin Pen Needle 31G X 6 MM MISC 1 Device by Does not apply route 2 (two) times daily. 05/30/15   Marinda Elk, MD   BP 130/87 mmHg  Pulse 93  Temp(Src) 98.2 F (36.8 C) (Oral)  Resp 22  Ht  (1.854 m)  Wt 68.04 kg  BMI 19.79 kg/m2  SpO2 98% Physical Exam  Constitutional: He is oriented to person, place, and time. He appears well-developed and well-nourished. No distress.  Caucasian male, appears uncomfortable.  HENT:  Head: Atraumatic.  Mouth is dry.  Eyes: Conjunctivae and EOM are normal. Pupils are equal, round, and reactive to light.  Neck: Normal range of motion. Neck supple.  No nuchal rigidity  Cardiovascular: Normal rate and regular rhythm.   Pulmonary/Chest: Effort normal and breath sounds normal.  Abdominal: Soft. Bowel sounds are normal. He exhibits no distension. There is no tenderness.  Neurological: He is alert and oriented to person, place, and time. He has normal strength. No cranial nerve deficit or sensory deficit. GCS eye subscore is 4. GCS  verbal subscore is 5. GCS motor subscore is 6.  Skin: No rash noted.  Psychiatric: He has a normal mood and affect.  Nursing note and vitals reviewed.   ED Course  Procedures (including critical care time) Labs Review Labs Reviewed  BASIC METABOLIC PANEL - Abnormal; Notable for the following:    Chloride 100 (*)    Glucose, Bld 250 (*)    All other components within normal limits  URINALYSIS, ROUTINE W REFLEX MICROSCOPIC (NOT AT [facility]) -  Abnormal; Notable for the following:    Glucose, UA >1000 (*)    Ketones, ur 15 (*)    All other components within normal limits  CBG MONITORING, ED - Abnormal; Notable for the following:    Glucose-Capillary 224 (*)    All other components within normal limits  CBG MONITORING, ED - Abnormal; Notable for the following:    Glucose-Capillary 186 (*)    All other components within normal limits  CBC  URINE MICROSCOPIC-ADD ON    Imaging Review No results found. I have personally reviewed and evaluated these images and lab results as part of my medical decision-making.   EKG Interpretation None     ED ECG REPORT   Date: 06/03/2015  Rate: 101  Rhythm: sinus tachycardia  QRS Axis: normal Borderline right axis deviation  Intervals: borderline prolonged QT interval  ST/T Wave abnormalities: normal  Conduction Disutrbances:none  Narrative Interpretation:   Old EKG Reviewed: none available  I have personally reviewed the EKG tracing and agree with the computerized printout as noted.   MDM   Final diagnoses:  Nonketotic hyperglycinemia (HCC)    BP 116/64 mmHg  Pulse 102  Temp(Src) 98.5 F (36.9 C) (Rectal)  Resp 18  Ht  (1.854 m)  Wt 68.04 kg  BMI 19.79 kg/m2  SpO2 96%   11:48 PM Patient with newly diagnosed diabetes, here with generalized weakness, lightheadedness and feeling unwell. He does have a mildly elevated CBG of 250 without anion gap. Urine shows 15 ketones. No leukocytosis. Had a chest x-ray done 5 days ago that shows no acute abnormality. Urinates without any sense. Tract infection. He does complain of headache but he does not have any fever, nuchal rigidity or focal neuro deficit to suggest meningitis. He appears dehydrated, IV fluid given.  12:52 AM CBG improves to 180 after IVF.  Headache improves with tylenol.  Pt still appears drowsy but amenable to f/u with his endocrinologist and to establish primary care for further management. He is mildly  hypotensive and tachycardic without having a documented fever.  No nuchal rigidity on reexamination.  No recent flu-like sxs, no myalgias.  I discussed this with Dr. Juleen China who recommend rectal temp and ECG.  Will check.    1:05 AM Pt has normal rectal temp.  EKG without acute ischemic changes.  Mild tachycardia but pt denies SOB and i have low suspicion for PE as pt has no risk factors.  Pt stable for discharge. Return precaution discussed.    Fayrene Helper, PA-C 06/03/15 1610  April Palumbo, MD 06/03/15 [phone]

## 2015-06-02 NOTE — ED Notes (Signed)
Pt was recently newly diagnosed with diabetes and spent 2 days in the hospital.  Pt was discharged Friday and felt fine Saturday but over the weekend he began having trouble controlling blood sugar and Sunday afternoon he began having dizziness and blurred vision as well as palpitations.  Pt has been feeling very weak and today at work he felt "off" and he has had dizziness and lost balance and had a hard time walking.  Pt continues to feel weak and appears to feel really unwell.  CBG has been elevated in mid 200's and has had some in 300's.  CBG in triage is 224.

## 2015-06-03 LAB — CBG MONITORING, ED: Glucose-Capillary: 186 mg/dL — ABNORMAL HIGH (ref 65–99)

## 2015-06-03 NOTE — Discharge Instructions (Signed)
Please follow up closely with endocrinologist and also to establish care with a primary care provider for further management of your condition.  Return promptly to the ER if you have any concerns.    Hyperglycemia Hyperglycemia occurs when the glucose (sugar) in your blood is too high. Hyperglycemia can happen for many reasons, but it most often happens to people who do not know they have diabetes or are not managing their diabetes properly.  CAUSES  Whether you have diabetes or not, there are other causes of hyperglycemia. Hyperglycemia can occur when you have diabetes, but it can also occur in other situations that you might not be as aware of, such as: Diabetes  If you have diabetes and are having problems controlling your blood glucose, hyperglycemia could occur because of some of the following reasons:  Not following your meal plan.  Not taking your diabetes medications or not taking it properly.  Exercising less or doing less activity than you normally do.  Being sick. Pre-diabetes  This cannot be ignored. Before people develop Type 2 diabetes, they almost always have "pre-diabetes." This is when your blood glucose levels are higher than normal, but not yet high enough to be diagnosed as diabetes. Research has shown that some long-term damage to the body, especially the heart and circulatory system, may already be occurring during pre-diabetes. If you take action to manage your blood glucose when you have pre-diabetes, you may delay or prevent Type 2 diabetes from developing. Stress  If you have diabetes, you may be "diet" controlled or on oral medications or insulin to control your diabetes. However, you may find that your blood glucose is higher than usual in the hospital whether you have diabetes or not. This is often referred to as "stress hyperglycemia." Stress can elevate your blood glucose. This happens because of hormones put out by the body during times of stress. If stress has  been the cause of your high blood glucose, it can be followed regularly by your caregiver. That way he/she can make sure your hyperglycemia does not continue to get worse or progress to diabetes. Steroids  Steroids are medications that act on the infection fighting system (immune system) to block inflammation or infection. One side effect can be a rise in blood glucose. Most people can produce enough extra insulin to allow for this rise, but for those who cannot, steroids make blood glucose levels go even higher. It is not unusual for steroid treatments to "uncover" diabetes that is developing. It is not always possible to determine if the hyperglycemia will go away after the steroids are stopped. A special blood test called an A1c is sometimes done to determine if your blood glucose was elevated before the steroids were started. SYMPTOMS  Thirsty.  Frequent urination.  Dry mouth.  Blurred vision.  Tired or fatigue.  Weakness.  Sleepy.  Tingling in feet or leg. DIAGNOSIS  Diagnosis is made by monitoring blood glucose in one or all of the following ways:  A1c test. This is a chemical found in your blood.  Fingerstick blood glucose monitoring.  Laboratory results. TREATMENT  First, knowing the cause of the hyperglycemia is important before the hyperglycemia can be treated. Treatment may include, but is not be limited to:  Education.  Change or adjustment in medications.  Change or adjustment in meal plan.  Treatment for an illness, infection, etc.  More frequent blood glucose monitoring.  Change in exercise plan.  Decreasing or stopping steroids.  Lifestyle changes. HOME  CARE INSTRUCTIONS   Test your blood glucose as directed.  Exercise regularly. Your caregiver will give you instructions about exercise. Pre-diabetes or diabetes which comes on with stress is helped by exercising.  Eat wholesome, balanced meals. Eat often and at regular, fixed times. Your caregiver  or nutritionist will give you a meal plan to guide your sugar intake.  Being at an ideal weight is important. If needed, losing as little as 10 to 15 pounds may help improve blood glucose levels. SEEK MEDICAL CARE IF:   You have questions about medicine, activity, or diet.  You continue to have symptoms (problems such as increased thirst, urination, or weight gain). SEEK IMMEDIATE MEDICAL CARE IF:   You are vomiting or have diarrhea.  Your breath smells fruity.  You are breathing faster or slower.  You are very sleepy or incoherent.  You have numbness, tingling, or pain in your feet or hands.  You have chest pain.  Your symptoms get worse even though you have been following your caregiver's orders.  If you have any other questions or concerns.   This information is not intended to replace advice given to you by your health care provider. Make sure you discuss any questions you have with your health care provider.   Document Released: 09/22/2000 Document Revised: 06/21/2011 Document Reviewed: 12/03/2014 Elsevier Interactive Patient Education [employer]

## 2015-06-25 ENCOUNTER — Encounter: Payer: Self-pay | Admitting: Internal Medicine

## 2015-06-25 ENCOUNTER — Ambulatory Visit (INDEPENDENT_AMBULATORY_CARE_PROVIDER_SITE_OTHER): Payer: BC Managed Care – PPO | Admitting: Internal Medicine

## 2015-06-25 VITALS — BP 112/80 | HR 110 | Temp 98.6°F | Resp 12 | Ht 73.0 in | Wt 169.4 lb

## 2015-06-25 DIAGNOSIS — IMO0001 Reserved for inherently not codable concepts without codable children: Secondary | ICD-10-CM

## 2015-06-25 DIAGNOSIS — E119 Type 2 diabetes mellitus without complications: Secondary | ICD-10-CM | POA: Diagnosis not present

## 2015-06-25 DIAGNOSIS — R000 Tachycardia, unspecified: Secondary | ICD-10-CM

## 2015-06-25 DIAGNOSIS — Z794 Long term (current) use of insulin: Secondary | ICD-10-CM

## 2015-06-25 DIAGNOSIS — E1149 Type 2 diabetes mellitus with other diabetic neurological complication: Secondary | ICD-10-CM

## 2015-06-25 LAB — T4, FREE: FREE T4: 0.65 ng/dL (ref 0.60–1.60)

## 2015-06-25 LAB — TSH: TSH: 1.51 u[IU]/mL (ref 0.35–4.50)

## 2015-06-25 LAB — T3, FREE: T3 FREE: 3.5 pg/mL (ref 2.3–4.2)

## 2015-06-25 LAB — VITAMIN B12: Vitamin B-12: 261 pg/mL (ref 211–911)

## 2015-06-25 MED ORDER — INSULIN ASPART 100 UNIT/ML FLEXPEN: 4.0000 [IU] | PEN_INJECTOR | Freq: Three times a day (TID) | SUBCUTANEOUS | Status: DC

## 2015-06-25 MED ORDER — INSULIN DETEMIR 100 UNIT/ML FLEXPEN: 35.0000 [IU] | PEN_INJECTOR | Freq: Every day | SUBCUTANEOUS | Status: DC

## 2015-06-25 NOTE — Patient Instructions (Addendum)
Please decrease Levemir to 35 units at bedtime. If sugars in am are <130 in 1 week, can decrease further to 30 units.  Please take the Novolog as follows (15 min before each meal): - before a smaller meal: 4 units - before a regular meal: 6 units - before a large meal: 8 units  Please change the NovoLog Sliding scale: 150-200: + 1 unit 201-250: + 2 units 251-300: + 3 units 301-350: + 4 units >350: + 5 units  Please return in 1-1.5 months with your sugar log.   Please let me know if the sugars are consistently <80 or >200.  Please stop at the lab.  PATIENT INSTRUCTIONS FOR TYPE 2 DIABETES:  DIET AND EXERCISE Diet and exercise is an important part of diabetic treatment.  We recommended aerobic exercise in the form of brisk walking (working between 40-60% of maximal aerobic capacity, similar to brisk walking) for 150 minutes per week (such as 30 minutes five days per week) along with 3 times per week performing 'resistance' training (using various gauge rubber tubes with handles) 5-10 exercises involving the major muscle groups (upper body, lower body and core) performing 10-15 repetitions (or near fatigue) each exercise. Start at half the above goal but build slowly to reach the above goals. If limited by weight, joint pain, or disability, we recommend daily walking in a swimming pool with water up to waist to reduce pressure from joints while allow for adequate exercise.    BLOOD GLUCOSES Monitoring your blood glucoses is important for continued management of your diabetes. Please check your blood glucoses 2-4 times a day: fasting, before meals and at bedtime (you can rotate these measurements - e.g. one day check before the 3 meals, the next day check before 2 of the meals and before bedtime, etc.).   HYPOGLYCEMIA (low blood sugar) Hypoglycemia is usually a reaction to not eating, exercising, or taking too much insulin/ other diabetes drugs.  Symptoms include tremors, sweating, hunger,  confusion, headache, etc. Treat IMMEDIATELY with 15 grams of Carbs: . 4 glucose tablets .  cup regular juice/soda . 2 tablespoons raisins . 4 teaspoons sugar . 1 tablespoon honey Recheck blood glucose in 15 mins and repeat above if still symptomatic/blood glucose <100.  RECOMMENDATIONS TO REDUCE YOUR RISK OF DIABETIC COMPLICATIONS: * Take your prescribed MEDICATION(S) * Follow a DIABETIC diet: Complex carbs, fiber rich foods, (monounsaturated and polyunsaturated) fats * AVOID saturated/trans fats, high fat foods, >2,300 mg salt per day. * EXERCISE at least 5 times a week for 30 minutes or preferably daily.  * DO NOT SMOKE OR DRINK more than 1 drink a day. * Check your FEET every day. Do not wear tightfitting shoes. Contact us if you develop an ulcer * See your EYE doctor once a year or more if needed * Get a FLU shot once a year * Get a PNEUMONIA vaccine once before and once after age 34 years  GOALS:  * Your Hemoglobin A1c of <7%  * fasting sugars need to be <130 * after meals sugars need to be <180 (2h after you start eating) * Your Systolic BP should be 140 or lower  * Your Diastolic BP should be 80 or lower  * Your HDL (Good Cholesterol) should be 40 or higher  * Your LDL (Bad Cholesterol) should be 100 or lower. * Your Triglycerides should be 150 or lower  * Your Urine microalbumin (kidney function) should be <30 * Your Body Mass Index should be 25  or lower    Please consider the following ways to cut down carbs and fat and increase fiber and micronutrients in your diet: - substitute whole grain for white bread or pasta - substitute brown rice for white rice - substitute 90-calorie flat bread pieces for slices of bread when possible - substitute sweet potatoes or yams for white potatoes - substitute humus for margarine - substitute tofu for cheese when possible - substitute almond or rice milk for regular milk (would not drink soy milk daily due to concern for soy  estrogen influence on breast cancer risk) - substitute dark chocolate for other sweets when possible - substitute water - can add lemon or orange slices for taste - for diet sodas (artificial sweeteners will trick your body that you can eat sweets without getting calories and will lead you to overeating and weight gain in the long run) - do not skip breakfast or other meals (this will slow down the metabolism and will result in more weight gain over time)  - can try smoothies made from fruit and almond/rice milk in am instead of regular breakfast - can also try old-fashioned (not instant) oatmeal made with almond/rice milk in am - order the dressing on the side when eating salad at a restaurant (pour less than half of the dressing on the salad) - eat as little meat as possible - can try juicing, but should not forget that juicing will get rid of the fiber, so would alternate with eating raw veg./fruits or drinking smoothies - use as little oil as possible, even when using olive oil - can dress a salad with a mix of balsamic vinegar and lemon juice, for e.g. - use agave nectar, stevia sugar, or regular sugar rather than artificial sweateners - steam or broil/roast veggies  - snack on veggies/fruit/nuts (unsalted, preferably) when possible, rather than processed foods - reduce or eliminate aspartame in diet (it is in diet sodas, chewing gum, etc) Read the labels!  Try to read Dr. Katherina Right book: "Program for Reversing Diabetes" for other ideas for healthy eating.

## 2015-06-25 NOTE — Progress Notes (Signed)
Patient ID: Greg Goodman, male   DOB: [birth year], 35 y.o.   MRN: [id number]  HPI: Greg Goodman is a 35 y.o.-year-old male, referred by [facility] - Dr David Stall, for management of DM2, dx in 05/2015, insulin-dependent, uncontrolled, without complications.  He tells me that in 05/2014 >> started to feel poorly, with dizziness, lightheadedness, and chest pressure. Retrospectively, he also had increased thirst, increased urination, increased hunger with unintentional weight loss (40 lbs in 1 year!), blurry vision.  He went to UC >> CBG: "HI" >> sent to ED: 561. He was hospitalized and then discharged on insulin.  He was in the ED 2x after the initial admission >> weak, blurry vision - 06/02/2015 (Non-ketotic Hgly - 200-300). Then, he was in the ED (High Pt Reg) 06/17/2015 >> same sxs (Non-ketotic Hgly - 200s).   Last hemoglobin A1c was: Lab Results  Component Value Date   HGBA1C 13.3* 05/29/2015   Component     Latest Ref Rng 05/29/2015  Glutamic Acid Decarb Ab     0.0 - 5.0 U/mL <5.0   Pt is on a regimen of: - Levemir 40 >> 45 units at bedtime - Novolog 4 >> 6 units 3x a day, before meals - SSI Novolog: target 140, ISF 20  Pt checks his sugars 4x a day and they are - 2 week ave: 201: - am: 51, 78-350 - 2h after b'fast: 95-211, 365 - before lunch: 103-204, 264 - 2h after lunch: 178-212, 290 - before dinner: 152, 277 - 2h after dinner: 165-284 - bedtime: 118-188 - nighttime: n/c No lows. Lowest sugar was 51; ? hypoglycemia awareness.  Highest sugar was 365.  Glucometer: OneTouch Reveal  Pt's meals are: - Breakfast: 2 eggs + English muffin or toast or cereals - Lunch: sandwich wrap or salad - Dinner: chicken + salad + rice - Snacks: rice cakes Diet sodas.  Exercises daily >> cardio.  - no CKD, last BUN/creatinine:  Lab Results  Component Value Date   BUN 14 06/02/2015   CREATININE 0.90 06/02/2015   - last set of lipids: No results found for: CHOL, HDL,  LDLCALC, LDLDIRECT, TRIG, CHOLHDL - last eye exam was in 2014. Not dilated.  - + numbness and tingling in his feet - started ~1.5 years ago.  Pt has FH of DM in maternal aunt.  ROS: Constitutional: + Both weight gain/loss, + fatigue, + subjective hyperthermia, + increased urination and nocturia, + poor sleep Eyes: + blurry vision, no xerophthalmia ENT: no sore throat, no nodules palpated in throat, no dysphagia/odynophagia, no hoarseness Cardiovascular: + CP/+ SOB/no palpitations/leg swelling Respiratory: no cough/+ SOB Gastrointestinal: + N/no V/+ D/no C Musculoskeletal: + muscle/ no joint aches Skin: no rashes, + itching Neurological: no tremors/numbness/tingling/dizziness, + headache Psychiatric: + depression/+ anxiety  Past Medical History  Diagnosis Date  . Diabetes mellitus without complication (HCC) 05/2015    NEW ONSET  . Depression   . Anxiety    Past Surgical History  Procedure Laterality Date  . Appendectomy     Social History   Social History  . Marital Status: Single    Spouse Name: N/A  . Number of Children: 0   Occupational History  .  English teacher    Social History Main Topics  . Smoking status: Former Smoker -- 0.25 packs/day - quit 2017  . Smokeless tobacco: Never Used  . Alcohol Use: Yes     Comment: occ  . Drug Use: No  . Sexual Activity: Yes   Current Outpatient  Prescriptions on File Prior to Visit  Medication Sig Dispense Refill  . glucose blood test strip Use as instructed (Patient taking differently: Use to test blood sugar 4 times daily as instructed. One Touch Verio) 100 each 12  . ibuprofen (ADVIL,MOTRIN) 400 MG tablet Take 400 mg by mouth every 6 (six) hours as needed for mild pain.    . Insulin Pen Needle 31G X 6 MM MISC 1 Device by Does not apply route 2 (two) times daily. 60 each 3   No current facility-administered medications on file prior to visit.   No Known Allergies   FH: - Maternal aunt with diabetes type 2,  insulin-dependent - No known family history of autoimmune diseases.  PE: BP 112/80 mmHg  Pulse 110  Temp(Src) 98.6 F (37 C) (Oral)  Resp 12  Ht 6\' 1"  (1.854 m)  Wt 169 lb 6.4 oz (76.839 kg)  BMI 22.35 kg/m2  SpO2 97% Wt Readings from Last 3 Encounters:  06/25/15 169 lb 6.4 oz (76.839 kg)  06/02/15 150 lb (68.04 kg)  05/29/15 150 lb 12.7 oz (68.4 kg)   Constitutional: normal weight, in NAD Eyes: PERRLA, EOMI, no exophthalmos ENT: moist mucous membranes, no thyromegaly, no cervical lymphadenopathy Cardiovascular: tachycardia, RR, No MRG Respiratory: CTA B Gastrointestinal: abdomen soft, NT, ND, BS+ Musculoskeletal: no deformities, strength intact in all 4 Skin: moist, warm, no rashes Neurological: + tremor with outstretched hands, DTR normal in all 4  ASSESSMENT: 1. DM, insulin-dependent, new dx - GAD Ab's negative  2. Tachycardia - Associated with weight loss, anxiety, tremors  PLAN:  1. Patient with new dx of diabetes, on basal-bolus insulin regimen, with improving control, but sugars still mostly high. He has large fluctuations in his sugars in am, which may be from too much basal insulin. I explained that the ideal insulin regimen would contain ~ equal total daily doses of basal or bolus insulins. At this point, he takes 45 units of basal insulin and 18 units of bolus (NovoLog insulin). There is an imbalance between the total daily doses, which I think is contributing to his still abnormal sugars. The higher Levemir dosing can contribute to lows during sleep or between meals.  - we discussed that it it is not clear at this point whether he has type 1 or type 2 DM. He had normal-high bicarbonate while in the hospital or in the ED, and his GAD antibodies were negative. These would point towards type 2 diabetes. However, he is young and with normal weight (although he lost 40 pounds in the last year) and his sugars are still high despite a fairly large amount of insulin per day.  Today, in the office, his glucose is in the 190s, so I will check a C-peptide and will add islet cell antibodies. If he turns out to have DM2 >> will slowly transition to oral meds. - I will refer him to nutrition and diabetes education (we could not cover all DM training at this visit: e.g. Insulin injections, insulin dosing in certain situations: e.g. With exercise; etc.); we did discuss about diet, but needs more dietary advice - I suggested to:  Patient Instructions  Please decrease Levemir to 35 units at bedtime. If sugars in am are <130 in 1 week, can decrease further to 30 units.  Please take the Novolog as follows (15 min before each meal): - before a smaller meal: 4 units - before a regular meal: 6 units - before a large meal: 8 units  Please  change the NovoLog Sliding scale: 150-200: + 1 unit 201-250: + 2 units 251-300: + 3 units 301-350: + 4 units >350: + 5 units  Please return in 1-1.5 months with your sugar log.   Please let me know if the sugars are consistently <80 or >200.  Please stop at the lab.  - Strongly advised him to start checking sugars at different times of the day - check at least 3 times a day, rotating checks - given sugar log and advised how to fill it and to bring it at next appt  - given foot care handout and explained the principles  - given instructions for hypoglycemia management "15-15 rule"  - advised for yearly eye exams >> he needs on - I will also check a B12 level since he complains of neuropathic sxs - Return to clinic in 1-1.5 mo with sugar log   2. Tachycardia - he has several sxs that may be consistent with thyrotoxicosis: weight loss (however, now gained back weight), anxiety, tremors, heat intolerance, Chest pressure (normal troponin and EKG, exc. Tachycardia, at last ED visit - records reviewed) - I would like to check his TFTs at this visit  Orders Placed This Encounter  Procedures  . T3, free  . T4, free  . TSH  . C-peptide   . Glucose, Fasting  . Anti-islet cell antibody  . Vitamin B12  . Amb ref to Medical Nutrition Therapy-MNT  . Ambulatory referral to diabetic education   - time spent with the patient: 1 hour, of which >50% was spent in obtaining information about his symptoms, reviewing his previous labs, evaluations, and treatments, counseling him about his condition (please see the discussed topics above), and developing a plan to further investigate and treat it; he had a number of questions which I addressed.   Component     Latest Ref Rng 06/25/2015  Triiodothyronine,Free,Serum     2.3 - 4.2 pg/mL 3.5  T4,Free(Direct)     0.60 - 1.60 ng/dL 1.19  TSH     1.47 - 8.29 uIU/mL 1.51  C-Peptide     0.80-3.85 ng/mL 0.87  Glucose, Fasting     65 - 99 mg/dL 562 (H)  Pancreatic Islet Cell Antibody     < 5 JDF Units <5  Vitamin B12     211 - 911 pg/mL 261  TFTs normal. Anti-pancreatic antibodies negative. C-peptide low normal. He may have a degree of insulin deficiency, but is not clearly evident now.

## 2015-06-26 LAB — C-PEPTIDE: C PEPTIDE: 0.87 ng/mL (ref 0.80–3.85)

## 2015-06-26 LAB — GLUCOSE, FASTING: Glucose, Fasting: 161 mg/dL — ABNORMAL HIGH (ref 65–99)

## 2015-07-01 LAB — ANTI-ISLET CELL ANTIBODY

## 2015-07-17 ENCOUNTER — Ambulatory Visit (INDEPENDENT_AMBULATORY_CARE_PROVIDER_SITE_OTHER): Payer: BC Managed Care – PPO

## 2015-07-17 DIAGNOSIS — R000 Tachycardia, unspecified: Secondary | ICD-10-CM | POA: Diagnosis not present

## 2015-07-29 ENCOUNTER — Ambulatory Visit: Payer: BC Managed Care – PPO

## 2015-08-12 ENCOUNTER — Ambulatory Visit (INDEPENDENT_AMBULATORY_CARE_PROVIDER_SITE_OTHER): Payer: BC Managed Care – PPO | Admitting: Internal Medicine

## 2015-08-12 ENCOUNTER — Encounter: Payer: Self-pay | Admitting: Internal Medicine

## 2015-08-12 ENCOUNTER — Other Ambulatory Visit (INDEPENDENT_AMBULATORY_CARE_PROVIDER_SITE_OTHER): Payer: BC Managed Care – PPO | Admitting: *Deleted

## 2015-08-12 VITALS — BP 122/80 | HR 110 | Temp 98.7°F | Resp 12 | Wt 170.0 lb

## 2015-08-12 DIAGNOSIS — E119 Type 2 diabetes mellitus without complications: Secondary | ICD-10-CM

## 2015-08-12 DIAGNOSIS — E1165 Type 2 diabetes mellitus with hyperglycemia: Secondary | ICD-10-CM | POA: Diagnosis not present

## 2015-08-12 DIAGNOSIS — Z794 Long term (current) use of insulin: Secondary | ICD-10-CM | POA: Diagnosis not present

## 2015-08-12 DIAGNOSIS — IMO0001 Reserved for inherently not codable concepts without codable children: Secondary | ICD-10-CM

## 2015-08-12 LAB — POCT GLYCOSYLATED HEMOGLOBIN (HGB A1C): HEMOGLOBIN A1C: 6.8

## 2015-08-12 MED ORDER — INSULIN DEGLUDEC 200 UNIT/ML ~~LOC~~ SOPN: 35.0000 [IU] | PEN_INJECTOR | Freq: Every day | SUBCUTANEOUS | Status: DC

## 2015-08-12 MED ORDER — GABAPENTIN 100 MG PO CAPS: 100.0000 mg | ORAL_CAPSULE | Freq: Two times a day (BID) | ORAL | Status: DC

## 2015-08-12 NOTE — Progress Notes (Signed)
Patient ID: Greg Goodman, male   DOB: 08/16/1980, 35 y.o.   MRN: [id number]  HPI: Greg BentJon Route is a 35 y.o.-year-old male, initially referred by [facility] - Dr David StallFeliz-Ortiz, now returning for follow-up for DM2, dx in 05/2015, insulin-dependent, uncontrolled, with complications (peripheral neuropathy). Last visit 1.5 months ago.  Reviewed hx: In 05/2014 >> started to feel poorly, with dizziness, lightheadedness, and chest pressure. Retrospectively, he also had increased thirst, increased urination, increased hunger with unintentional weight loss (40 lbs in 1 year!), blurry vision.  He went to UC >> CBG: "HI" >> sent to ED: 561. He was hospitalized and then discharged on insulin.  He was in the ED 2x after the initial admission >> weak, blurry vision - 06/02/2015 (Non-ketotic Hgly - 200-300). Then, he was in the ED (High Pt Reg) 06/17/2015 >> same sxs (Non-ketotic Hgly - 200s).   Last hemoglobin A1c was: Lab Results  Component Value Date   HGBA1C 13.3* 05/29/2015   Component     Latest Ref Rng 05/29/2015  Glutamic Acid Decarb Ab     0.0 - 5.0 U/mL <5.0   Component     Latest Ref Rng 06/25/2015  C-Peptide     0.80-3.85 ng/mL 0.87  Glucose, Fasting     65 - 99 mg/dL 829161 (H)  Pancreatic Islet Cell Antibody     < 5 JDF Units <5  Vitamin B12     211 - 911 pg/mL 261  Anti-pancreatic antibodies negative. C-peptide low normal. He may have a degree of insulin deficiency, but is not clearly evident now.  Pt was on a regimen of: - Levemir 40 >> 45 units at bedtime - Novolog 4 >> 6 units 3x a day, before meals - SSI Novolog: target 140, ISF 20  At last visit, we changed to: - Levemir 35 units at bedtime - Novolog as follows (15 min before each meal): - before a smaller meal: 4 units - before a regular meal: 6 units - before a large meal: 8 units - NovoLog Sliding scale: 150-200: + 1 unit 201-250: + 2 units 251-300: + 3 units 301-350: + 4 units >350: + 5 units  Pt checks  his sugars 4-6x a day and they are (per his very detailed log): - am: 51, 78-350 >> 114-212, 255 - sick - 2h after b'fast: 95-211, 365 >> 131-196, 241 - before lunch: 103-204, 264 >> 96, 134-234, 279 - 2h after lunch: 178-212, 290 >> 91, 124-217 - before dinner: 152, 277 >> 72x1, 89x1, 150-238 - 2h after dinner: 165-284 >> n/c - bedtime: 118-188 >> 92-289, 295 - nighttime: n/c No lows. Lowest sugar was 51 >> 53; ? hypoglycemia awareness.  Highest sugar was 365 >> 295.  Glucometer: OneTouch Reveal  Pt's meals are: - Breakfast: 2 eggs + English muffin or toast or cereals - Lunch: sandwich wrap or salad - Dinner: chicken + salad + rice - Snacks: rice cakes Diet sodas.  Exercises daily >> cardio.  - no CKD, last BUN/creatinine:  Lab Results  Component Value Date   BUN 14 06/02/2015   CREATININE 0.90 06/02/2015   - last set of lipids: No results found for: CHOL, HDL, LDLCALC, LDLDIRECT, TRIG, CHOLHDL - last eye exam was in 2014. Not dilated.  - + numbness and tingling in his feet - started ~1.5 years ago.  ROS: Constitutional: no weight gain/loss, no fatigue, no subjective hyperthermia/hypothermia Eyes: + blurry vision, no xerophthalmia ENT: no sore throat, no nodules palpated in throat, no dysphagia/odynophagia,  no hoarseness Cardiovascular: + CP/no SOB/palpitations/leg swelling Respiratory: no cough/SOB Gastrointestinal: + N/no V/D/C Musculoskeletal: no muscle/joint aches Skin: no rashes Neurological: + tremors/+ numbness and burning pain in feet/+ tingling in feet/no dizziness  I reviewed pt's medications, allergies, PMH, social hx, family hx, and changes were documented in the history of present illness. Otherwise, unchanged from my initial visit note.  Past Medical History  Diagnosis Date  . Diabetes mellitus without complication (HCC) 05/2015    NEW ONSET  . Depression   . Anxiety    Past Surgical History  Procedure Laterality Date  . Appendectomy     Social  History   Social History  . Marital Status: Single    Spouse Name: N/A  . Number of Children: 0   Occupational History  .  English teacher    Social History Main Topics  . Smoking status: Former Smoker -- 0.25 packs/day - quit 2017  . Smokeless tobacco: Never Used  . Alcohol Use: Yes     Comment: occ  . Drug Use: No  . Sexual Activity: Yes   Current Outpatient Prescriptions on File Prior to Visit  Medication Sig Dispense Refill  . glucose blood test strip Use as instructed (Patient taking differently: Use to test blood sugar 4 times daily as instructed. One Touch Verio) 100 each 12  . ibuprofen (ADVIL,MOTRIN) 400 MG tablet Take 400 mg by mouth every 6 (six) hours as needed for mild pain.    Marland Kitchen insulin aspart (NOVOLOG) 100 UNIT/ML FlexPen Inject 4-10 Units into the skin 3 (three) times daily with meals. 15 mL 1  . Insulin Detemir (LEVEMIR) 100 UNIT/ML Pen Inject 35 Units into the skin at bedtime. 15 mL 1  . Insulin Pen Needle 31G X 6 MM MISC 1 Device by Does not apply route 2 (two) times daily. 60 each 3   No current facility-administered medications on file prior to visit.   No Known Allergies   FH: - Maternal aunt with diabetes type 2, insulin-dependent - No known family history of autoimmune diseases.  PE: BP 122/80 mmHg  Pulse 110  Temp(Src) 98.7 F (37.1 C) (Oral)  Resp 12  Wt 170 lb (77.111 kg)  SpO2 97% Body mass index is 22.43 kg/(m^2). Wt Readings from Last 3 Encounters:  08/12/15 170 lb (77.111 kg)  06/25/15 169 lb 6.4 oz (76.839 kg)  06/02/15 150 lb (68.04 kg)   Constitutional: normal weight, in NAD Eyes: PERRLA, EOMI, no exophthalmos ENT: moist mucous membranes, no thyromegaly, no cervical lymphadenopathy Cardiovascular: tachycardia, RR, No MRG Respiratory: CTA B Gastrointestinal: abdomen soft, NT, ND, BS+ Musculoskeletal: no deformities, strength intact in all 4 Skin: moist, warm, no rashes Neurological: Very faint tremor with outstretched hands, DTR  normal in all 4  ASSESSMENT: 1. DM2, insulin-dependent, with complications - Peripheral neuropathy  PLAN:  1. Patient with new dx of diabetes, on basal-bolus insulin regimen, with clearly improving control, but sugars still variable.  - At last visit, due to the large variability and also the fact that he does not have signs of insulin resistance as associated with type 2 diabetes, we tested him for type 1 diabetes and the tests were essentially negative, however, the C-peptide, while still in the normal range, was lower then expected. At this visit, we discussed the results and I explained that I believe that he has some degree of insulin deficiency, but he is not clearly a type I diabetic now. - Due to the variability in his blood sugar, including  low blood sugars, we will try to switch from Levemir to [place], which has lower blood sugar fluctuations and lower propensity for hypoglycemia. - I also advised him to schedule an appointment with nutrition for a carb counting, and then start an insulin to carb ratio of 1:8 - I also advised him to take less basal insulin after a day which he is more active to avoid lows at night - I suggested to:  Patient Instructions  Please stop Levemir and start Tresiba 35 units at bedtime. In the days that you are more active, take only 25 units at bedtime.  Continue: - Novolog as follows (15 min before each meal): - before a smaller meal: 4 units - before a regular meal: 6 units - before a large meal: 8 units - NovoLog Sliding scale: 150-200: + 1 unit 201-250: + 2 units 251-300: + 3 units 301-350: + 4 units >350: + 5 units  Please schedule an appt with Oran Rein with nutrition - carb counting.  After this, use the following ICR (insulin to carb ratio) 1:8.  Please start Neurontin 100 mg at bedtime and increase to 2x a day if needed.  Please return in 1.5 months with your sugar log.   - continue checking sugars at different times of the day -  check at least 3 times a day, rotating checks  - advised for yearly eye exams >> he needs one >> I again advised him to have - will need to check a cholesterol panel at the next visit - We checked a hemoglobin A1c today >> 6.8% (excellent decrease!) - Return to clinic in 1.5 mo with sugar log   2. Peripheral neuropathy  - He complains of classical symptoms of peripheral neuropathy - At last visit, we checked a B12 vitamin, which was low normal >> we started on oral supplement of B12 >> will recheck the level at next visit - At this visit, since he cannot sleep at night because of the pain, I suggested to start Neurontin - He may need a referral to neurology in the future   - time spent with the patient: 45 min, of which >50% was spent in reviewing hisvery detailed blood sugar log, discussing his hypo- and hyper-glycemic episodes, reviewing previous labs andinsulin doses and developing a plan to avoid hypo- and hyper-glycemia. We also address his peripheral neuropathy.

## 2015-08-12 NOTE — Patient Instructions (Addendum)
Please stop Levemir and start Tresiba 35 units at bedtime. In the days that you are more active, take only 25 units at bedtime.  Continue: - Novolog as follows (15 min before each meal): - before a smaller meal: 4 units - before a regular meal: 6 units - before a large meal: 8 units - NovoLog Sliding scale: 150-200: + 1 unit 201-250: + 2 units 251-300: + 3 units 301-350: + 4 units >350: + 5 units  Please schedule an appt with Oran ReinLaura Jobe with nutrition - carb counting.  After this, use the following ICR (insulin to carb ratio) 1:8.  Please start Neurontin 100 mg at bedtime and increase to 2x a day if needed.  Please return in 1.5 months with your sugar log.

## 2015-08-19 ENCOUNTER — Encounter: Payer: BC Managed Care – PPO | Attending: Internal Medicine | Admitting: *Deleted

## 2015-08-19 VITALS — Ht 73.0 in | Wt 167.3 lb

## 2015-08-19 DIAGNOSIS — E119 Type 2 diabetes mellitus without complications: Secondary | ICD-10-CM | POA: Insufficient documentation

## 2015-08-19 DIAGNOSIS — E109 Type 1 diabetes mellitus without complications: Secondary | ICD-10-CM

## 2015-08-19 NOTE — Progress Notes (Signed)
Patient was seen on 08/19/2015 for the first of a series of three diabetes self-management courses at the [facility].  Patient Education Plan per assessed needs and concerns is to attend four course education program for Diabetes Self Management Education.  The following learning objectives were met by the patient during this class:  Describe diabetes  State some common risk factors for diabetes  Defines the role of glucose and insulin  Identifies type of diabetes and pathophysiology  Describe the relationship between diabetes and cardiovascular risk  State the members of the Healthcare Team  States the rationale for glucose monitoring  State when to test glucose  State their individual Target Range  State the importance of logging glucose readings  Describe how to interpret glucose readings  Identifies A1C target  Explain the correlation between A1c and eAG values  State symptoms and treatment of high blood glucose  State symptoms and treatment of low blood glucose  Explain proper technique for glucose testing  Identifies proper sharps disposal  Handouts given during class include:  Living Well with Diabetes book  Carb Counting and Meal Planning book  Meal Plan Card  Carbohydrate guide  Meal planning worksheet  Low Sodium Flavoring Tips  The diabetes portion plate  D5H to eAG Conversion Chart  Diabetes Medications  Diabetes Recommended Care Schedule  Support Group  Diabetes Success Plan  Core Class Satisfaction Survey  Follow-Up Plan:  Attend core 2

## 2015-08-26 DIAGNOSIS — E109 Type 1 diabetes mellitus without complications: Secondary | ICD-10-CM

## 2015-08-26 DIAGNOSIS — E119 Type 2 diabetes mellitus without complications: Secondary | ICD-10-CM | POA: Diagnosis not present

## 2015-08-26 NOTE — Progress Notes (Signed)
Patient was seen on 08/26/15 for the second of a series of three diabetes self-management courses at the [facility]. The following learning objectives were met by the patient during this class:   Describe the role of different macronutrients on glucose  Explain how carbohydrates affect blood glucose  State what foods contain the most carbohydrates  Demonstrate carbohydrate counting  Demonstrate how to read Nutrition Facts food label  Describe effects of various fats on heart health  Describe the importance of good nutrition for health and healthy eating strategies  Describe techniques for managing your shopping, cooking and meal planning  List strategies to follow meal plan when dining out  Describe the effects of alcohol on glucose and how to use it safely  Goals:  Follow Diabetes Meal Plan as instructed  Eat 3 meals and 2 snacks, every 3-5 hrs  Aim for carbohydrate intake of 75 grams carbohydrate/meal Aim for carbohydrate intake of 0-30 grams carbohydrate/snack Add lean protein foods to meals/snacks  Monitor glucose levels as instructed by your doctor   Follow-Up Plan:  Attend Core 3  Work towards following your personal food plan.

## 2015-09-02 ENCOUNTER — Ambulatory Visit: Payer: BC Managed Care – PPO

## 2015-10-02 ENCOUNTER — Ambulatory Visit (INDEPENDENT_AMBULATORY_CARE_PROVIDER_SITE_OTHER): Payer: BC Managed Care – PPO | Admitting: Internal Medicine

## 2015-10-02 ENCOUNTER — Encounter: Payer: Self-pay | Admitting: Internal Medicine

## 2015-10-02 DIAGNOSIS — IMO0001 Reserved for inherently not codable concepts without codable children: Secondary | ICD-10-CM

## 2015-10-02 DIAGNOSIS — E1149 Type 2 diabetes mellitus with other diabetic neurological complication: Secondary | ICD-10-CM | POA: Diagnosis not present

## 2015-10-02 DIAGNOSIS — Z794 Long term (current) use of insulin: Principal | ICD-10-CM

## 2015-10-02 LAB — LIPID PANEL
CHOLESTEROL: 176 mg/dL (ref 0–200)
HDL: 74.9 mg/dL (ref >39.00)
LDL Cholesterol: 92 mg/dL (ref 0–99)
NonHDL: 100.9
TRIGLYCERIDES: 45 mg/dL (ref 0.0–149.0)
Total CHOL/HDL Ratio: 2
VLDL: 9 mg/dL (ref 0.0–40.0)

## 2015-10-02 LAB — COMPLETE METABOLIC PANEL WITH GFR
ALBUMIN: 4.9 g/dL (ref 3.6–5.1)
ALK PHOS: 54 U/L (ref 40–115)
ALT: 14 U/L (ref 9–46)
AST: 17 U/L (ref 10–40)
BILIRUBIN TOTAL: 0.7 mg/dL (ref 0.2–1.2)
BUN: 11 mg/dL (ref 7–25)
CO2: 29 mmol/L (ref 20–31)
Calcium: 9.9 mg/dL (ref 8.6–10.3)
Chloride: 100 mmol/L (ref 98–110)
Creat: 0.94 mg/dL (ref 0.60–1.35)
GFR, Est African American: >89 mL/min (ref >=60)
GFR, Est Non African American: >89 mL/min (ref >=60)
GLUCOSE: 86 mg/dL (ref 65–99)
Potassium: 5.2 mmol/L (ref 3.5–5.3)
SODIUM: 140 mmol/L (ref 135–146)
TOTAL PROTEIN: 7.6 g/dL (ref 6.1–8.1)

## 2015-10-02 LAB — MICROALBUMIN / CREATININE URINE RATIO
CREATININE, U: 283.5 mg/dL
MICROALB UR: 3.7 mg/dL — AB (ref 0.0–1.9)
MICROALB/CREAT RATIO: 1.3 mg/g (ref 0.0–30.0)

## 2015-10-02 LAB — TSH: TSH: 2.03 u[IU]/mL (ref 0.35–4.50)

## 2015-10-02 MED ORDER — INSULIN DEGLUDEC 200 UNIT/ML ~~LOC~~ SOPN: 32.0000 [IU] | PEN_INJECTOR | Freq: Every day | SUBCUTANEOUS | Status: DC

## 2015-10-02 NOTE — Patient Instructions (Signed)
Please decrease Tresiba to 32 units at bedtime.  Change the mealtime Novolog to an insulin to carb ratio (ICR) of 1:8. If you have lows after meals after this, increase the ICR to 1:10.  Continue NovoLog Sliding scale: 150-200: + 1 unit 201-250: + 2 units 251-300: + 3 units 301-350: + 4 units >350: + 5 units  Please return in 1.5 months with your sugar log.   Please stop at the lab.

## 2015-10-02 NOTE — Progress Notes (Addendum)
Patient ID: Greg Goodman, male   DOB: 12/31/1980, 35 y.o.   MRN: [id number]  HPI: Greg BentJon Goodman is a 35 y.o.-year-old male, initially referred by [facility] - Dr David StallFeliz-Ortiz, now returning for follow-up for DM2, dx in 05/2015, insulin-dependent, uncontrolled, with complications (peripheral neuropathy). Last visit 1.5 months ago.  He saw [name] for carb counting teaching. He feels comfortable with this now.  Reviewed hx: In 05/2014 >> started to feel poorly, with dizziness, lightheadedness, and chest pressure. Retrospectively, he also had increased thirst, increased urination, increased hunger with unintentional weight loss (40 lbs in 1 year!), blurry vision.  He went to UC >> CBG: "HI" >> sent to ED: 561. He was hospitalized and then discharged on insulin.  He was in the ED 2x after the initial admission >> weak, blurry vision - 06/02/2015 (Non-ketotic Hgly - 200-300). Then, he was in the ED (High Pt Reg) 06/17/2015 >> same sxs (Non-ketotic Hgly - 200s).   Last hemoglobin A1c was: Lab Results  Component Value Date   HGBA1C 6.8 08/12/2015   HGBA1C 13.3* 05/29/2015   Component     Latest Ref Rng 05/29/2015  Glutamic Acid Decarb Ab     0.0 - 5.0 U/mL <5.0   Component     Latest Ref Rng 06/25/2015  C-Peptide     0.80-3.85 ng/mL 0.87  Glucose, Fasting     65 - 99 mg/dL 244161 (H)  Pancreatic Islet Cell Antibody     < 5 JDF Units <5  Vitamin B12     211 - 911 pg/mL 261  Anti-pancreatic antibodies negative. C-peptide low normal. He may have a degree of insulin deficiency, but is not clearly evident now.  Pt was on a regimen of: - Levemir 40 >> 45 units at bedtime - Novolog 4 >> 6 units 3x a day, before meals - SSI Novolog: target 140, ISF 20  At last visit, we changed to: - Tresiba 36 units at bedtime - Novolog as follows (15 min before each meal): - before a smaller meal: 4 units - before a regular meal: 6 units - before a large meal: 8 units - NovoLog Sliding  scale: 150-200: + 1 unit 201-250: + 2 units 251-300: + 3 units 301-350: + 4 units >350: + 5 units  Pt checks his sugars 4-6x a day and they are (per his very detailed log): - am: 51, 78-350 >> 114-212, 255 - sick >> 88, 110-163, 192, 243 - 2h after b'fast: 95-211, 365 >> 131-196, 241 >> 199 - before lunch: 103-204, 264 >> 96, 134-234, 279 >> 82-180 - 2h after lunch: 178-212, 290 >> 91, 124-217 >> 75-175, 205 - before dinner: 152, 277 >> 72x1, 89x1, 150-238 >> 78-145, 195, 235 - 2h after dinner: 165-284 >> n/c >> 95-125, 252 - bedtime: 118-188 >> 92-289, 295 >> 115-181 - nighttime: n/c160 No lows. Lowest sugar was 51 >> 53 >> ; ? hypoglycemia awareness.  Highest sugar was 365 >> 295 >> 200s.  Glucometer: OneTouch Reveal  Pt's meals are: - Breakfast: 2 eggs + English muffin or toast or cereals (6 units) - Lunch: sandwich wrap or salad (4 units) - Dinner: chicken + salad + rice (6 units)  - Snacks: rice cakes Diet sodas.  Exercises daily >> cardio.  - no CKD, last BUN/creatinine:  Lab Results  Component Value Date   BUN 14 06/02/2015   CREATININE 0.90 06/02/2015   - last set of lipids: No results found for: CHOL, HDL, LDLCALC, LDLDIRECT, TRIG,  CHOLHDL - last eye exam was in 2014. Not dilated.  - + numbness and tingling in his feet - started ~1.5 years ago.  ROS: Constitutional: no weight gain/loss, + fatigue, no subjective hyperthermia/hypothermia Eyes: no blurry vision, no xerophthalmia ENT: no sore throat, no nodules palpated in throat, no dysphagia/odynophagia, no hoarseness Cardiovascular: no CP/SOB/palpitations/leg swelling Respiratory: no cough/SOB Gastrointestinal: no N/V/D/C Musculoskeletal: no muscle/joint aches Skin: no rashes Neurological: no tremors/numbness/tingling/dizziness  I reviewed pt's medications, allergies, PMH, social hx, family hx, and changes were documented in the history of present illness. Otherwise, unchanged from my initial visit  note.  Past Medical History  Diagnosis Date  . Diabetes mellitus without complication (HCC) 05/2015    NEW ONSET  . Depression   . Anxiety    Past Surgical History  Procedure Laterality Date  . Appendectomy     Social History   Social History  . Marital Status: Single    Spouse Name: N/A  . Number of Children: 0   Occupational History  .  English teacher    Social History Main Topics  . Smoking status: Former Smoker -- 0.25 packs/day - quit 2017  . Smokeless tobacco: Never Used  . Alcohol Use: Yes     Comment: occ  . Drug Use: No  . Sexual Activity: Yes   Current Outpatient Prescriptions on File Prior to Visit  Medication Sig Dispense Refill  . gabapentin (NEURONTIN) 100 MG capsule Take 1 capsule (100 mg total) by mouth 2 (two) times daily. 60 capsule 2  . glucose blood test strip Use as instructed (Patient taking differently: Use to test blood sugar 4 times daily as instructed. One Touch Verio) 100 each 12  . ibuprofen (ADVIL,MOTRIN) 400 MG tablet Take 400 mg by mouth every 6 (six) hours as needed for mild pain.    Marland Kitchen insulin aspart (NOVOLOG) 100 UNIT/ML FlexPen Inject 4-10 Units into the skin 3 (three) times daily with meals. 15 mL 1  . Insulin Degludec (TRESIBA FLEXTOUCH) 200 UNIT/ML SOPN Inject 36 Units into the skin at bedtime. 3 pen 2  . Insulin Pen Needle 31G X 6 MM MISC 1 Device by Does not apply route 2 (two) times daily. 60 each 3   No current facility-administered medications on file prior to visit.   No Known Allergies   FH: - Maternal aunt with diabetes type 2, insulin-dependent - No known family history of autoimmune diseases.  PE: BP 130/88 mmHg  Pulse 125  Ht  (1.854 m)  Wt 172 lb 3.2 oz (78.109 kg)  BMI 22.72 kg/m2  SpO2 98% Body mass index is 22.72 kg/(m^2). Wt Readings from Last 3 Encounters:  10/02/15 172 lb 3.2 oz (78.109 kg)  08/19/15 167 lb 4.8 oz (75.887 kg)  08/12/15 170 lb (77.111 kg)   Constitutional: normal weight, in  NAD Eyes: PERRLA, EOMI, no exophthalmos ENT: moist mucous membranes, no thyromegaly, no cervical lymphadenopathy Cardiovascular: tachycardia, RR, No MRG Respiratory: CTA B Gastrointestinal: abdomen soft, NT, ND, BS+ Musculoskeletal: no deformities, strength intact in all 4 Skin: moist, warm, no rashes Neurological: Very faint tremor with outstretched hands, DTR normal in all 4  ASSESSMENT: 1. 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 Patient with recent dx of diabetes, on basal-bolus insulin regimen, with improving control, but sugars still variable.  - due to the large variability and also the fact that he does not have signs of insulin resistance as associated with type 2  diabetes, we tested him for type 1 diabetes and the tests were essentially negative, however, the C-peptide, while still in the normal range, was lower then expected. We are managing him as DM1. - Due to the low blood sugars with increased activity >> will decrease Tresiba dose - I also advised him to start an insulin to carb ratio of 1:8 - reviewed last HBA1c >> 6.8% (much improved) - I suggested to:  Patient Instructions  Please decrease Tresiba to 32 units at bedtime.  Change the mealtime Novolog to an insulin to carb ratio (ICR) of 1:8. If you have lows after meals after this, increase the ICR to 1:10.  Continue NovoLog Sliding scale: 150-200: + 1 unit 201-250: + 2 units 251-300: + 3 units 301-350: + 4 units >350: + 5 units  Please return in 1.5 months with your sugar log.   Please stop at the lab.  - continue checking sugars at different times of the day - check at least 3 times a day, rotating checks >> he is doing a great job with this - advised for yearly eye exams >> he needs one! - will check: Orders Placed This Encounter  Procedures  . Microalbumin / creatinine urine ratio  . Lipid panel  . TSH  . COMPLETE METABOLIC PANEL WITH GFR  -  Return to clinic in 1.5 mo with sugar log   2. Peripheral neuropathy  - He complains of classical symptoms of peripheral neuropathy - a B12 vitamin, which was low normal >> we started on oral supplement of B12 - At last visit, we started Neurontin 100 mg bid >> he sleeps better now - He may need a referral to neurology in the future   - time spent with the patient: 40 min, of which >50% was spent in reviewing hisvery detailed blood sugar log, discussing his hypo- and hyper-glycemic episodes, reviewing previous labs and insulin doses and developing a plan to avoid hypo- and hyper-glycemia. We also addressed his peripheral neuropathy.  Office Visit on 10/02/2015  Component Date Value Ref Range Status  . Microalb, Ur 10/02/2015 3.7* 0.0 - 1.9 mg/dL Final  . Creatinine,U 40/98/1191 283.5   Final  . Microalb Creat Ratio 10/02/2015 1.3  0.0 - 30.0 mg/g Final  . Cholesterol 10/02/2015 176  0 - 200 mg/dL Final   ATP III Classification       Desirable:  < 200 mg/dL               Borderline High:  200 - 239 mg/dL          High:  > = 478 mg/dL  . Triglycerides 10/02/2015 45.0  0.0 - 149.0 mg/dL Final   Normal:  <295 mg/dLBorderline High:  150 - 199 mg/dL  . HDL 10/02/2015 74.90  >39.00 mg/dL Final  . VLDL 62/13/0865 9.0  0.0 - 78.4 mg/dL Final  . LDL Cholesterol 10/02/2015 92  0 - 99 mg/dL Final  . Total CHOL/HDL Ratio 10/02/2015 2   Final                  Men          Women1/2 Average Risk     3.4          3.3Average Risk          5.0          4.42X Average Risk          9.6  7.13X Average Risk          15.0          11.0                      . NonHDL 10/02/2015 100.90   Final   NOTE:  Non-HDL goal should be 30 mg/dL higher than patient's LDL goal (i.e. LDL goal of < 70 mg/dL, would have non-HDL goal of < 100 mg/dL)  . TSH 10/02/2015 2.03  0.35 - 4.50 uIU/mL Final  . Sodium 10/02/2015 140  135 - 146 mmol/L Final  . Potassium 10/02/2015 5.2  3.5 - 5.3 mmol/L Final  . Chloride 10/02/2015 100   98 - 110 mmol/L Final  . CO2 10/02/2015 29  20 - 31 mmol/L Final  . Glucose, Bld 10/02/2015 86  65 - 99 mg/dL Final  . BUN 86/57/846906/22/2017 11  7 - 25 mg/dL Final  . Creat 62/95/284106/22/2017 0.94  0.60 - 1.35 mg/dL Final  . Total Bilirubin 10/02/2015 0.7  0.2 - 1.2 mg/dL Final  . Alkaline Phosphatase 10/02/2015 54  40 - 115 U/L Final  . AST 10/02/2015 17  10 - 40 U/L Final  . ALT 10/02/2015 14  9 - 46 U/L Final  . Total Protein 10/02/2015 7.6  6.1 - 8.1 g/dL Final  . Albumin 32/44/010206/22/2017 4.9  3.6 - 5.1 g/dL Final  . Calcium 72/53/664406/22/2017 9.9  8.6 - 10.3 mg/dL Final  . GFR, Est African American 10/02/2015 >89  >=60 mL/min Final  . GFR, Est Non African American 10/02/2015 >89  >=60 mL/min Final   Excellent labs!

## 2015-11-03 ENCOUNTER — Encounter: Payer: Self-pay | Admitting: Internal Medicine

## 2015-11-05 ENCOUNTER — Other Ambulatory Visit: Payer: Self-pay | Admitting: Internal Medicine

## 2015-12-03 ENCOUNTER — Ambulatory Visit: Payer: BC Managed Care – PPO | Admitting: Internal Medicine

## 2015-12-11 ENCOUNTER — Other Ambulatory Visit: Payer: Self-pay | Admitting: Internal Medicine

## 2016-01-11 ENCOUNTER — Other Ambulatory Visit: Payer: Self-pay | Admitting: Internal Medicine

## 2016-01-21 LAB — HM DIABETES EYE EXAM

## 2016-02-05 ENCOUNTER — Ambulatory Visit (INDEPENDENT_AMBULATORY_CARE_PROVIDER_SITE_OTHER): Payer: BC Managed Care – PPO | Admitting: Internal Medicine

## 2016-02-05 ENCOUNTER — Encounter: Payer: Self-pay | Admitting: Internal Medicine

## 2016-02-05 VITALS — BP 122/82 | HR 83 | Ht 74.0 in | Wt 195.0 lb

## 2016-02-05 DIAGNOSIS — E1343 Other specified diabetes mellitus with diabetic autonomic (poly)neuropathy: Secondary | ICD-10-CM | POA: Diagnosis not present

## 2016-02-05 DIAGNOSIS — IMO0001 Reserved for inherently not codable concepts without codable children: Secondary | ICD-10-CM

## 2016-02-05 DIAGNOSIS — E1149 Type 2 diabetes mellitus with other diabetic neurological complication: Secondary | ICD-10-CM | POA: Diagnosis not present

## 2016-02-05 DIAGNOSIS — Z794 Long term (current) use of insulin: Secondary | ICD-10-CM

## 2016-02-05 DIAGNOSIS — E1142 Type 2 diabetes mellitus with diabetic polyneuropathy: Secondary | ICD-10-CM

## 2016-02-05 LAB — POCT GLYCOSYLATED HEMOGLOBIN (HGB A1C): HEMOGLOBIN A1C: 5.3

## 2016-02-05 MED ORDER — GABAPENTIN 100 MG PO CAPS: ORAL_CAPSULE | ORAL | 1 refills | Status: DC

## 2016-02-05 NOTE — Progress Notes (Addendum)
Patient ID: Greg Goodman, male   DOB: 01/29/1981, 35 y.o.   MRN: [id number]  HPI: Greg BentJon Hornik is a 35 y.o.-year-old male, initially referred by [facility] - Dr David StallFeliz-Ortiz, now returning for follow-up for DM2, dx in 05/2015, insulin-dependent, uncontrolled, with complications (peripheral neuropathy). Last visit 4 months ago.  Reviewed hx: In 05/2014 >> started to feel poorly, with dizziness, lightheadedness, and chest pressure. Retrospectively, he also had increased thirst, increased urination, increased hunger with unintentional weight loss (40 lbs in 1 year!), blurry vision.  He went to UC >> CBG: "HI" >> sent to ED: 561. He was hospitalized and then discharged on insulin.  He was in the ED 2x after the initial admission >> weak, blurry vision - 06/02/2015 (Non-ketotic Hgly - 200-300). Then, he was in the ED (High Pt Reg) 06/17/2015 >> same sxs (Non-ketotic Hgly - 200s).   Since then, he did really well on a basal-bolus insulin regimen.   He saw [name] for carb counting teaching. He feels comfortable with this now.   Component     Latest Ref Rng 05/29/2015  Glutamic Acid Decarb Ab     0.0 - 5.0 U/mL <5.0   Component     Latest Ref Rng 06/25/2015  C-Peptide     0.80-3.85 ng/mL 0.87  Glucose, Fasting     65 - 99 mg/dL 409161 (H)  Pancreatic Islet Cell Antibody     < 5 JDF Units <5  Vitamin B12     211 - 911 pg/mL 261  Anti-pancreatic antibodies negative. C-peptide low normal. He may have a degree of insulin deficiency, but this was not clearly evident...  Last hemoglobin A1c was: Lab Results  Component Value Date   HGBA1C 6.8 08/12/2015   HGBA1C 13.3 (H) 05/29/2015   He is on: - Tresiba 36 units at bedtime - Novolog as follows (15 min before each meal) - ICR 1:8 - NovoLog Sliding scale: 150-200: + 1 unit 201-250: + 2 units 251-300: + 3 units 301-350: + 4 units >350: + 5 units  Pt checks his sugars 4-6x a day and they are (per his very detailed log): -  am: 51, 78-350 >> 114-212, 255 - sick >> 88, 110-163, 192, 243 >> 87-165, 186, 210 - 2h after b'fast: 95-211, 365 >> 131-196, 241 >> 199 >> 60, 62 - before lunch: 103-204, 264 >> 96, 134-234, 279 >> 82-180 >> 71-176, 226 - 2h after lunch: 178-212, 290 >> 91, 124-217 >> 75-175, 205 >> 48-90 - before dinner: 152, 277 >> 72x1, 89x1, 150-238 >> 78-145, 195, 235 >> 68-153, 219 - 2h after dinner: 165-284 >> n/c >> 95-125, 252 >> 142 - bedtime: 118-188 >> 92-289, 295 >> 115-181 >> 74-156, 169 - nighttime: n/c160 No lows. Lowest sugar was 51 >> 53 >> 48x1; ? hypoglycemia awareness.  Highest sugar was 365 >> 295 >> 200s >> 200s.  Glucometer: OneTouch Reveal  Pt's meals are: - Breakfast: 2 eggs + English muffin or toast or cereals (6 units) - Lunch: sandwich wrap or salad (4 units) - Dinner: chicken + salad + rice (6 units)  - Snacks: rice cakes Diet sodas.  Exercises daily >> cardio. May have lows after exercise  - no CKD, last BUN/creatinine:  Lab Results  Component Value Date   BUN 11 10/02/2015   CREATININE 0.94 10/02/2015   - last set of lipids: Lab Results  Component Value Date   CHOL 176 10/02/2015   HDL 74.90 10/02/2015   LDLCALC 92 10/02/2015  TRIG 45.0 10/02/2015   CHOLHDL 2 10/02/2015   - last eye exam was: Abstract on 01/21/2016  Component Date Value Ref Range Status  . HM Diabetic Eye Exam 01/21/2016 No Retinopathy  No Retinopathy Final    - + numbness and tingling in his feet - started ~2 years ago. On Neurontin 100 mg 2x a day. Still having pain - more during the day.  ROS: Constitutional: + weight gain, no fatigue, no subjective hyperthermia/hypothermia Eyes: no blurry vision, no xerophthalmia ENT: no sore throat, no nodules palpated in throat, no dysphagia/odynophagia, no hoarseness Cardiovascular: no CP/SOB/palpitations/leg swelling Respiratory: no cough/SOB Gastrointestinal: no N/V/D/C Musculoskeletal: no muscle/joint aches Skin: no rashes Neurological:  no tremors/numbness/tingling/dizziness  I reviewed pt's medications, allergies, PMH, social hx, family hx, and changes were documented in the history of present illness. Otherwise, unchanged from my initial visit note.  Past Medical History:  Diagnosis Date  . Anxiety   . Depression   . Diabetes mellitus without complication (HCC) 05/2015   NEW ONSET   Past Surgical History:  Procedure Laterality Date  . APPENDECTOMY     Social History   Social History  . Marital Status: Single    Spouse Name: N/A  . Number of Children: 0   Occupational History  .  English teacher    Social History Main Topics  . Smoking status: Former Smoker -- 0.25 packs/day - quit 2017  . Smokeless tobacco: Never Used  . Alcohol Use: Yes     Comment: occ  . Drug Use: No  . Sexual Activity: Yes   Current Outpatient Prescriptions on File Prior to Visit  Medication Sig Dispense Refill  . gabapentin (NEURONTIN) 100 MG capsule TAKE 1 CAPSULE(100 MG) BY MOUTH TWICE DAILY 60 capsule 1  . glucose blood test strip Use as instructed (Patient taking differently: Use to test blood sugar 4 times daily as instructed. One Touch Verio) 100 each 12  . ibuprofen (ADVIL,MOTRIN) 400 MG tablet Take 400 mg by mouth every 6 (six) hours as needed for mild pain.    Marland Kitchen insulin aspart (NOVOLOG) 100 UNIT/ML FlexPen Inject 4-10 Units into the skin 3 (three) times daily with meals. 15 mL 1  . Insulin Degludec (TRESIBA FLEXTOUCH) 200 UNIT/ML SOPN Inject 32 Units into the skin at bedtime. 3 pen 2  . Insulin Pen Needle 31G X 6 MM MISC 1 Device by Does not apply route 2 (two) times daily. 60 each 3  . TRESIBA FLEXTOUCH 200 UNIT/ML SOPN INJECT 36 UNITS UNDER THE SKIN EVERY NIGHT AT BEDTIME 9 mL 0   No current facility-administered medications on file prior to visit.    No Known Allergies   FH: - Maternal aunt with diabetes type 2, insulin-dependent - No known family history of autoimmune diseases.  PE: BP 122/82 (BP Location: Left  Arm, Patient Position: Sitting)   Pulse 83   Ht 6\' 2"  (1.88 m)   Wt 195 lb (88.5 kg)   SpO2 97%   BMI 25.04 kg/m  Body mass index is 25.04 kg/m. Wt Readings from Last 3 Encounters:  02/05/16 195 lb (88.5 kg)  10/02/15 172 lb 3.2 oz (78.1 kg)  08/19/15 167 lb 4.8 oz (75.9 kg)   Constitutional: normal weight, in NAD Eyes: PERRLA, EOMI, no exophthalmos ENT: moist mucous membranes, no thyromegaly, no cervical lymphadenopathy Cardiovascular: tachycardia, RR, No MRG Respiratory: CTA B Gastrointestinal: abdomen soft, NT, ND, BS+ Musculoskeletal: no deformities, strength intact in all 4 Skin: moist, warm, no rashes Neurological: Very  faint tremor with outstretched hands, DTR normal in all 4  ASSESSMENT: 1. 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 Patient with recent dx of diabetes, on basal-bolus insulin regimen, with improving control, now with low sugars after meals as he did not increase the ICR as discussed at last visit. - we tested him for type 1 diabetes and the tests were essentially negative, however, the C-peptide, while still in the normal range, was lower then expected. We are managing him as DM1. Will check another C pp and Glu as CBG 126 today - Due to the low blood sugars after meals >> will increase his ICR with B and L - I also gave him instructions about insulin dosing if he exercises - I suggested to:  Patient Instructions  Please continue: - Tresiba 32 units at bedtime  Please change the ICR for Novolog: - 1:10 with b'fast and lunch - 1:8 with dinner  If you exercise within 2h after a meal, take 1 unit less with that meal. If you exercise after 2h from a meal, take a 15g carb snack.  - continue checking sugars at different times of the day - check at least 3 times a day, rotating checks >> he is doing a great job with this - advised for yearly eye exams >> he is UTD - HbA1c today: 5.3% (great!) - Return to  clinic in 1.5 mo with sugar log   2. Peripheral neuropathy  - He complains of classical symptoms of peripheral neuropathy - a B12 vitamin, which was low normal >> we started on oral supplement of B12 - we then added Neurontin 100 mg bid >> he sleeps better now but still pain during the day - will add alpha Lipoic acid - He may need a referral to neurology in the future   Component     Latest Ref Rng & Units 02/05/2016  Hemoglobin A1C      5.3  C-Peptide     0.80 - 3.85 ng/mL 1.33  Glucose, Fasting     65 - 99 mg/dL 97   Labs again indicated good insulin production...  - time spent with the patient: 40 min, of which >50% was spent in reviewing hisvery detailed blood sugar log (will scan), discussing his hypo- and hyper-glycemic episodes, reviewing previous labs and insulin doses and developing a plan to avoid hypo- and hyper-glycemia. We also addressed his peripheral neuropathy.  Carlus Pavlov, MD PhD [facility]

## 2016-02-05 NOTE — Patient Instructions (Addendum)
Please continue: - Tresiba 32 units at bedtime  Please change the ICR for Novolog: - 1:10 with b'fast and lunch - 1:8 with dinner  If you exercise within 2h after a meal, take 1 unit less with that meal. If you exercise after 2h from a meal, take a 15g carb snack.  Start alpha-lipoic acid 600 mg 2 a day  Continue B12 1000 mcg daily.  Please return in 1.5 months with your sugar log.   Please stop at the lab.

## 2016-02-06 LAB — GLUCOSE, FASTING: Glucose, Fasting: 97 mg/dL (ref 65–99)

## 2016-02-06 LAB — C-PEPTIDE: C-Peptide: 1.33 ng/mL (ref 0.80–3.85)

## 2016-03-10 ENCOUNTER — Other Ambulatory Visit: Payer: Self-pay | Admitting: Internal Medicine

## 2016-03-31 ENCOUNTER — Ambulatory Visit: Payer: BC Managed Care – PPO | Admitting: Internal Medicine

## 2016-05-04 ENCOUNTER — Ambulatory Visit (INDEPENDENT_AMBULATORY_CARE_PROVIDER_SITE_OTHER): Payer: BC Managed Care – PPO | Admitting: Internal Medicine

## 2016-05-04 ENCOUNTER — Encounter: Payer: Self-pay | Admitting: Internal Medicine

## 2016-05-04 VITALS — BP 132/88 | HR 100 | Ht 73.5 in | Wt 198.0 lb

## 2016-05-04 DIAGNOSIS — IMO0001 Reserved for inherently not codable concepts without codable children: Secondary | ICD-10-CM

## 2016-05-04 DIAGNOSIS — E1149 Type 2 diabetes mellitus with other diabetic neurological complication: Secondary | ICD-10-CM

## 2016-05-04 DIAGNOSIS — Z794 Long term (current) use of insulin: Secondary | ICD-10-CM | POA: Diagnosis not present

## 2016-05-04 DIAGNOSIS — E1142 Type 2 diabetes mellitus with diabetic polyneuropathy: Secondary | ICD-10-CM

## 2016-05-04 LAB — POCT GLYCOSYLATED HEMOGLOBIN (HGB A1C): Hemoglobin A1C: 6.3

## 2016-05-04 MED ORDER — METFORMIN HCL 500 MG PO TABS: 1000.0000 mg | ORAL_TABLET | Freq: Two times a day (BID) | ORAL | 5 refills | Status: DC

## 2016-05-04 NOTE — Patient Instructions (Addendum)
Please continue: - Tresiba 32 units at bedtime - Novolog - ICR: - 1:10 with b'fast and lunch - 1:8 with dinner If you exercise within 2h after a meal, take 1 unit less with that meal. If you exercise after 2h from a meal, take a 15g carb snack.  Please start Metformin 500 mg with dinner x 4 days. If you tolerate this well, add another Metformin tablet (500 mg) with breakfast x 4 days. If you tolerate this well, add another metformin tablet with dinner (total 1000 mg) x 4 days. If you tolerate this well, add another metformin tablet with breakfast (total 1000 mg). Continue with 1000 mg of metformin 2x a day with breakfast and dinner.  Please return in 3 months with your sugar log.

## 2016-05-04 NOTE — Addendum Note (Signed)
Addended by: Darene LamerHOMPSON, Charlot Gouin T on: 05/04/2016 03:47 PM   Modules accepted: Orders

## 2016-05-04 NOTE — Progress Notes (Signed)
Patient ID: Greg Goodman, male   DOB: 10/09/1980, 36 y.o.   MRN: [id number]  HPI: Greg Goodman is a 36 y.o.-year-old male, returning for follow-up for DM2, dx in 05/2015, insulin-dependent, uncontrolled, with complications (peripheral neuropathy). Last visit 3 months ago.  Since last visit >> he hurt his toe >> developed cellulitis >> was on ABx for cellulitis.  Reviewed hx: In 05/2014 >> started to feel poorly, with dizziness, lightheadedness, and chest pressure. Retrospectively, he also had increased thirst, increased urination, increased hunger with unintentional weight loss (40 lbs in 1 year!), blurry vision.  He went to UC >> CBG: "HI" >> sent to ED: 561. He was hospitalized and then discharged on insulin.  He was in the ED 2x after the initial admission >> weak, blurry vision - 06/02/2015 (Non-ketotic Hgly - 200-300). Then, he was in the ED (High Pt Reg) 06/17/2015 >> same sxs (Non-ketotic Hgly - 200s).   Since then, he did really well on a basal-bolus insulin regimen.   He saw Greg LargeBeverly Goodman for carb counting teaching. He feels comfortable with this now.   Last hemoglobin A1c was: Lab Results  Component Value Date   HGBA1C 5.3 02/05/2016   HGBA1C 6.8 08/12/2015   HGBA1C 13.3 (H) 05/29/2015   He is on: - Tresiba 32 units at bedtime - Novolog - ICR: - 1:10 with b'fast and lunch - 1:8 with dinner If you exercise within 2h after a meal, take 1 unit less with that meal. If you exercise after 2h from a meal, take a 15g carb snack.  Pt checks his sugars 3-4x a day and they are (per his very detailed log): - am: 51, 78-350 >> 114-212, 255 - sick >> 88, 110-163, 192, 243 >> 87-165, 186, 210 >> 92-229, 241 - 2h after b'fast: 95-211, 365 >> 131-196, 241 >> 199 >> 60, 62 >> n/c - before lunch: 103-204, 264 >> 96, 134-234, 279 >> 82-180 >> 71-176, 226 >> 105-276, 301 - 2h after lunch: 178-212, 290 >> 91, 124-217 >> 75-175, 205 >> 48-90 >> n/c - before dinner: 152, 277 >> 72x1, 89x1,  150-238 >> 78-145, 195, 235 >> 68-153, 219 >> 147-198, 247 - 2h after dinner: 165-284 >> n/c >> 95-125, 252 >> 142 >> 179 - bedtime: 118-188 >> 92-289, 295 >> 115-181 >> 74-156, 169 >> n/c - nighttime: n/c160 No lows. Lowest sugar was 51 >> 53 >> 48x1 >> 92; ? hypoglycemia awareness.  Highest sugar was 365 >> 295 >> 200s >> 200s >> 301.  Glucometer: OneTouch Reveal  Pt's meals are: - Breakfast: 2 eggs + English muffin or toast or cereals (6 units) - Lunch: sandwich wrap or salad (4 units) - Dinner: chicken + salad + rice (6 units)  - Snacks: rice cakes Diet sodas.  Exercises daily >> cardio.  - no CKD, last BUN/creatinine:  Lab Results  Component Value Date   BUN 11 10/02/2015   CREATININE 0.94 10/02/2015   - last set of lipids: Lab Results  Component Value Date   CHOL 176 10/02/2015   HDL 74.90 10/02/2015   LDLCALC 92 10/02/2015   TRIG 45.0 10/02/2015   CHOLHDL 2 10/02/2015   - last eye exam was: Abstract on 01/21/2016  Component Date Value Ref Range Status  . HM Diabetic Eye Exam 01/21/2016 No Retinopathy  No Retinopathy Final    - + numbness and tingling in his feet - started ~2 years ago.  On Neurontin 100 mg 2x a day. On Greg >> He  is feeling much better after starting this.  ROS: Constitutional: + weight gain, no fatigue, no subjective hyperthermia/hypothermia Eyes: no blurry vision, no xerophthalmia ENT: no sore throat, no nodules palpated in throat, no dysphagia/odynophagia, no hoarseness Cardiovascular: no CP/SOB/palpitations/leg swelling Respiratory: no cough/SOB Gastrointestinal: no N/V/D/C Musculoskeletal: no muscle/joint aches Skin: no rashes Neurological: no tremors/numbness/tingling/dizziness  I reviewed pt's medications, allergies, PMH, social hx, family hx, and changes were documented in the history of present illness. Otherwise, unchanged from my initial visit note.  Past Medical History:  Diagnosis Date  . Anxiety   . Depression   . Diabetes  mellitus without complication (HCC) 05/2015   NEW ONSET   Past Surgical History:  Procedure Laterality Date  . APPENDECTOMY     Social History   Social History  . Marital Status: Single    Spouse Name: N/A  . Number of Children: 0   Occupational History  .  English teacher    Social History Main Topics  . Smoking status: Former Smoker -- 0.25 packs/day - quit 2017  . Smokeless tobacco: Never Used  . Alcohol Use: Yes     Comment: occ  . Drug Use: No  . Sexual Activity: Yes   Current Outpatient Prescriptions on File Prior to Visit  Medication Sig Dispense Refill  . gabapentin (NEURONTIN) 100 MG capsule TAKE 1 CAPSULE(100 MG) BY MOUTH TWICE DAILY 180 capsule 1  . glucose blood test strip Use as instructed (Patient taking differently: Use to test blood sugar 4 times daily as instructed. One Touch Verio) 100 each 12  . ibuprofen (ADVIL,MOTRIN) 400 MG tablet Take 400 mg by mouth every 6 (six) hours as needed for mild pain.    Marland Kitchen insulin aspart (NOVOLOG) 100 UNIT/ML FlexPen Inject 4-10 Units into the skin 3 (three) times daily with meals. 15 mL 1  . Insulin Degludec (TRESIBA FLEXTOUCH) 200 UNIT/ML SOPN Inject 32 Units into the skin at bedtime. 3 pen 2  . Insulin Pen Needle 31G X 6 MM MISC 1 Device by Does not apply route 2 (two) times daily. 60 each 3  . TRESIBA FLEXTOUCH 200 UNIT/ML SOPN INJECT 36 UNITS UNDER THE SKIN EVERY NIGHT AT BEDTIME 9 pen 2   No current facility-administered medications on file prior to visit.    No Known Allergies   FH: - Maternal aunt with diabetes type 2, insulin-dependent - No known family history of autoimmune diseases.  PE: BP 132/88 (BP Location: Left Arm, Patient Position: Sitting)   Pulse 100   Ht 6' 1.5" (1.867 m)   Wt 198 lb (89.8 kg)   SpO2 97%   BMI 25.77 kg/m  Body mass index is 25.77 kg/m. Wt Readings from Last 3 Encounters:  05/04/16 198 lb (89.8 kg)  02/05/16 195 lb (88.5 kg)  10/02/15 172 lb 3.2 oz (78.1 kg)    Constitutional: normal weight, in NAD Eyes: PERRLA, EOMI, no exophthalmos ENT: moist mucous membranes, no thyromegaly, no cervical lymphadenopathy Cardiovascular: tachycardia, RR, No MRG Respiratory: CTA B Gastrointestinal: abdomen soft, NT, ND, BS+ Musculoskeletal: no deformities, strength intact in all 4 Skin: moist, warm, no rashes Neurological: Very faint tremor with outstretched hands, DTR normal in all 4  ASSESSMENT: 1. DM2, insulin-dependent, with complications - Peripheral neuropathy  Component     Latest Ref Rng 05/29/2015  Glutamic Acid Decarb Ab     0.0 - 5.0 U/mL <5.0   Component     Latest Ref Rng 06/25/2015  C-Peptide     0.80-3.85 ng/mL 0.87  Glucose, Fasting     65 - 99 mg/dL 564 (H)  Pancreatic Islet Cell Antibody     < 5 JDF Units <5  Vitamin B12     211 - 911 pg/mL 261  Anti-pancreatic antibodies negative. C-peptide low normal. He may have a degree of insulin deficiency, but this was not clearly evident...  Component     Latest Ref Rng & Units 02/05/2016  Hemoglobin A1C      5.3  C-Peptide     0.80 - 3.85 ng/mL 1.33  Glucose, Fasting     65 - 99 mg/dL 97   Labs again indicated good insulin production...  2. 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 Patient with recent dx of diabetes, on basal-bolus insulin regimen, with worse control since last visit. He had low sugars after meals at last visit >> we increased his ICR with B and L. I also gave him instructions about insulin dosing if he exercises. He is not dropping his sugars after meals or during exercise anymore. - we tested him for type 1 diabetes and the tests were negative Right after diagnosis, however, the C-peptide, while still in the normal range, was lower then expected.  We recheck these tests at last visit and they were completely normal.  - As his sugars are higher, we cannot decrease his insulin yet. I would like to add metformin, though, and he agrees with this I explained the mechanism of action and  possible side effects. - I suggested to:  Patient Instructions  Please continue: - Tresiba 32 units at bedtime - Novolog - ICR: - 1:10 with b'fast and lunch - 1:8 with dinner If you exercise within 2h after a meal, take 1 unit less with that meal. If you exercise after 2h from a meal, take a 15g carb snack.  Please start Metformin 500 mg with dinner x 4 days. If you tolerate this well, add another Metformin tablet (500 mg) with breakfast x 4 days. If you tolerate this well, add another metformin tablet with dinner (total 1000 mg) x 4 days. If you tolerate this well, add another metformin tablet with breakfast (total 1000 mg). Continue with 1000 mg of metformin 2x a day with breakfast and dinner.  Please return in 3 months with your sugar log.   - continue checking sugars at different times of the day - check at least 3 times a day, rotating checks >> he is doing a great job with this - advised for yearly eye exams >> he is UTD - HbA1c today: 6.3% (Higher) - Return to clinic in 3 mo with sugar log   2. Peripheral neuropathy  - He complains of classical symptoms of peripheral neuropathy - a B12 vitamin, which was low normal >> we started on oral supplement of B12 - we then added Neurontin 100 mg bid >> he sleeps better now but still pain during the day - we then added alpha Lipoic acid at last visit >> he felt a difference, and he is feeling much better  Carlus Pavlov, MD PhD [facility]

## 2016-06-21 ENCOUNTER — Encounter: Payer: Self-pay | Admitting: Internal Medicine

## 2016-06-22 ENCOUNTER — Other Ambulatory Visit: Payer: Self-pay

## 2016-06-22 MED ORDER — GLUCOSE BLOOD VI STRP: ORAL_STRIP | 5 refills | Status: DC

## 2016-07-08 ENCOUNTER — Other Ambulatory Visit: Payer: Self-pay | Admitting: Internal Medicine

## 2016-08-03 ENCOUNTER — Encounter: Payer: Self-pay | Admitting: Internal Medicine

## 2016-08-03 ENCOUNTER — Ambulatory Visit (INDEPENDENT_AMBULATORY_CARE_PROVIDER_SITE_OTHER): Payer: BC Managed Care – PPO | Admitting: Internal Medicine

## 2016-08-03 VITALS — BP 124/84 | HR 88 | Ht 74.0 in | Wt 208.0 lb

## 2016-08-03 DIAGNOSIS — E1142 Type 2 diabetes mellitus with diabetic polyneuropathy: Secondary | ICD-10-CM | POA: Diagnosis not present

## 2016-08-03 DIAGNOSIS — IMO0001 Reserved for inherently not codable concepts without codable children: Secondary | ICD-10-CM

## 2016-08-03 DIAGNOSIS — Z794 Long term (current) use of insulin: Secondary | ICD-10-CM

## 2016-08-03 DIAGNOSIS — E1149 Type 2 diabetes mellitus with other diabetic neurological complication: Secondary | ICD-10-CM | POA: Diagnosis not present

## 2016-08-03 LAB — MICROALBUMIN / CREATININE URINE RATIO
CREATININE, U: 140.8 mg/dL
MICROALB UR: 1.6 mg/dL (ref 0.0–1.9)
Microalb Creat Ratio: 1.1 mg/g (ref 0.0–30.0)

## 2016-08-03 LAB — LIPID PANEL
CHOL/HDL RATIO: 3
Cholesterol: 193 mg/dL (ref 0–200)
HDL: 65.6 mg/dL (ref >39.00)
LDL Cholesterol: 108 mg/dL — ABNORMAL HIGH (ref 0–99)
NONHDL: 127.5
TRIGLYCERIDES: 97 mg/dL (ref 0.0–149.0)
VLDL: 19.4 mg/dL (ref 0.0–40.0)

## 2016-08-03 LAB — TSH: TSH: 3.12 u[IU]/mL (ref 0.35–4.50)

## 2016-08-03 LAB — POCT GLYCOSYLATED HEMOGLOBIN (HGB A1C): Hemoglobin A1C: 6.4

## 2016-08-03 MED ORDER — CANAGLIFLOZIN-METFORMIN HCL ER 50-500 MG PO TB24: 2.0000 | ORAL_TABLET | Freq: Every day | ORAL | 5 refills | Status: DC

## 2016-08-03 MED ORDER — INSULIN DEGLUDEC 200 UNIT/ML ~~LOC~~ SOPN: 36.0000 [IU] | PEN_INJECTOR | Freq: Every day | SUBCUTANEOUS | 5 refills | Status: DC

## 2016-08-03 NOTE — Patient Instructions (Addendum)
Please increase Tresiba to 36 units daily (may need more: 40 units if sugars are still high before meals).  Please continue: - Novolog: - 1:10 with b'fast and lunch - 1:8 with dinner Start: - InvokaMet ER 50-500 mg with b'fast. If you tolerate this well, increase to 2 tablets.  Please return in 3 months with your sugar log.

## 2016-08-03 NOTE — Progress Notes (Signed)
Patient ID: Greg Goodman, male   DOB: April 29, 1980, 36 y.o.   MRN: [id number]  HPI: Greg Goodman is a 36 y.o.-year-old male, returning for follow-up for DM2, dx in 05/2015, insulin-dependent, uncontrolled, with complications (peripheral neuropathy). Last visit 3 months ago.  Reviewed and addended hx: In 05/2014 >> started to feel poorly, with dizziness, lightheadedness, and chest pressure. Retrospectively, he also had increased thirst, increased urination, increased hunger with unintentional weight loss (40 lbs in 1 year!), blurry vision.  He went to UC >> CBG: "HI" >> sent to ED: 561. He was hospitalized and then discharged on insulin.  He was in the ED 2x after the initial admission >> weak, blurry vision - 06/02/2015 (Non-ketotic Hgly - 200-300). Then, he was in the ED (High Pt Reg) 06/17/2015 >> same sxs (Non-ketotic Hgly - 200s).   Since then, he remained on a basal-bolus insulin regimen.   He saw Pincus Large for carb counting teaching. He feels comfortable with this now.    We checked him for type 1 diabetes twice and the investigation was negative.  Last hemoglobin A1c was: Lab Results  Component Value Date   HGBA1C 6.3 05/04/2016   HGBA1C 5.3 02/05/2016   HGBA1C 6.8 08/12/2015   He is on: - Tresiba 32 units at bedtime - Novolog - ICR: - 1:10 with b'fast and lunch - 1:8 with dinner If you exercise within 2h after a meal, take 1 unit less with that meal. If you exercise after 2h from a meal, take a 15g carb snack.  Pt checks his sugars 3-4x a day and they are (Per his very detailed log): - am:  88, 110-163, 192, 243 >> 87-165, 186, 210 >> 92-229, 241 >> 141-265, 285 - 2h after b'fast: 95-211, 365 >> 131-196, 241 >> 199 >> 60, 62 >> n/c >> 161, 169 - before lunch:82-180 >> 71-176, 226 >> 105-276, 301 >> 120-279 - 2h after lunch: 178-212, 290 >> 91, 124-217 >> 75-175, 205 >> 48-90 >> n/c >> 110, 141 - before dinner:  78-145, 195, 235 >> 68-153, 219 >> 147-198, 247 >>  130-245 - 2h after dinner: 165-284 >> n/c >> 95-125, 252 >> 142 >> 179 >> 196 - bedtime: 118-188 >> 92-289, 295 >> 115-181 >> 74-156, 169 >> n/c - nighttime: n/c160 No lows. Lowest sugar was 51 >> 53 >> 48x1 >> 92 >> 110; ? hypoglycemia awareness.  Highest sugar was 365 >> 295 >> 200s >> 200s >> 301 >> 320 (no tresiba).  Glucometer: OneTouch Reveal  Pt's meals are: - Breakfast: 2 eggs + English muffin or toast or cereals (6 units) - Lunch: sandwich wrap or salad (4 units) - Dinner: chicken + salad + rice (6 units)  - Snacks: rice cakes Diet sodas.  He gained weight since last visit (10 pounds).  - no CKD, last BUN/creatinine:  Lab Results  Component Value Date   BUN 11 10/02/2015   CREATININE 0.94 10/02/2015   - last set of lipids: Lab Results  Component Value Date   CHOL 176 10/02/2015   HDL 74.90 10/02/2015   LDLCALC 92 10/02/2015   TRIG 45.0 10/02/2015   CHOLHDL 2 10/02/2015   - last eye exam was: Abstract on 01/21/2016  Component Date Value Ref Range Status  . HM Diabetic Eye Exam 01/21/2016 No Retinopathy  No Retinopathy Final    - + numbness and tingling in his feet - started ~2 years ago.  On Neurontin 100 mg 2x a day. On ALA >> He  is feeling much better after starting this.  ROS: Constitutional: + weight gain, no fatigue, + Hot flashes Eyes: no blurry vision, no xerophthalmia ENT: no sore throat, no nodules palpated in throat, no dysphagia/odynophagia, no hoarseness Cardiovascular: no CP/SOB/palpitations/leg swelling Respiratory: no cough/SOB Gastrointestinal: no N/V/D/C Musculoskeletal: no muscle/joint aches Skin: no rashes Neurological: no tremors/numbness/tingling/dizziness  I reviewed pt's medications, allergies, PMH, social hx, family hx, and changes were documented in the history of present illness. Otherwise, unchanged from my initial visit note.  Past Medical History:  Diagnosis Date  . Anxiety   . Depression   . Diabetes mellitus without  complication (HCC) 05/2015   NEW ONSET   Past Surgical History:  Procedure Laterality Date  . APPENDECTOMY     Social History   Social History  . Marital Status: Single    Spouse Name: N/A  . Number of Children: 0   Occupational History  .  English teacher    Social History Main Topics  . Smoking status: Former Smoker -- 0.25 packs/day - quit 2017  . Smokeless tobacco: Never Used  . Alcohol Use: Yes     Comment: occ  . Drug Use: No  . Sexual Activity: Yes   Current Outpatient Prescriptions on File Prior to Visit  Medication Sig Dispense Refill  . cephALEXin (KEFLEX) 500 MG capsule     . gabapentin (NEURONTIN) 100 MG capsule TAKE 1 CAPSULE(100 MG) BY MOUTH TWICE DAILY 180 capsule 0  . glucose blood (ONETOUCH VERIO) test strip Use as instructed to check sugar 4 times daily 400 each 5  . ibuprofen (ADVIL,MOTRIN) 400 MG tablet Take 400 mg by mouth every 6 (six) hours as needed for mild pain.    Marland Kitchen insulin aspart (NOVOLOG) 100 UNIT/ML FlexPen Inject 4-10 Units into the skin 3 (three) times daily with meals. 15 mL 1  . Insulin Degludec (TRESIBA FLEXTOUCH) 200 UNIT/ML SOPN Inject 32 Units into the skin at bedtime. 3 pen 2  . Insulin Pen Needle 31G X 6 MM MISC 1 Device by Does not apply route 2 (two) times daily. 60 each 3  . metFORMIN (GLUCOPHAGE) 500 MG tablet Take 2 tablets (1,000 mg total) by mouth 2 (two) times daily with a meal. 120 tablet 5  . TRESIBA FLEXTOUCH 200 UNIT/ML SOPN INJECT 36 UNITS UNDER THE SKIN EVERY NIGHT AT BEDTIME 9 pen 2   No current facility-administered medications on file prior to visit.    No Known Allergies   FH: - Maternal aunt with diabetes type 2, insulin-dependent - No known family history of autoimmune diseases.  PE: BP 124/84 (BP Location: Left Arm, Patient Position: Sitting)   Pulse 88   Ht  (1.88 m)   Wt 208 lb (94.3 kg)   SpO2 97%   BMI 26.71 kg/m  Body mass index is 26.71 kg/m. Wt Readings from Last 3 Encounters:  08/03/16 208  lb (94.3 kg)  05/04/16 198 lb (89.8 kg)  02/05/16 195 lb (88.5 kg)   Constitutional: overweight, in NAD Eyes: PERRLA, EOMI, no exophthalmos ENT: moist mucous membranes, no thyromegaly, no cervical lymphadenopathy Cardiovascular: RRR, No MRG Respiratory: CTA B Gastrointestinal: abdomen soft, NT, ND, BS+ Musculoskeletal: no deformities, strength intact in all 4 Skin: moist, warm, no rashes Neurological: faint tremor with outstretched hands, DTR normal in all 4  ASSESSMENT: 1. DM2, insulin-dependent, with complications - Peripheral neuropathy  Component     Latest Ref Rng 05/29/2015  Glutamic Acid Decarb Ab     0.0 - 5.0  U/mL <5.0   Component     Latest Ref Rng 06/25/2015  C-Peptide     0.80-3.85 ng/mL 0.87  Glucose, Fasting     65 - 99 mg/dL 161 (H)  Pancreatic Islet Cell Antibody     < 5 JDF Units <5  Vitamin B12     211 - 911 pg/mL 261  Anti-pancreatic antibodies negative. C-peptide low normal. He may have a degree of insulin deficiency, but this was not clearly evident...  Component     Latest Ref Rng & Units 02/05/2016  Hemoglobin A1C      5.3  C-Peptide     0.80 - 3.85 ng/mL 1.33  Glucose, Fasting     65 - 99 mg/dL 97   Labs again indicated good insulin production...  2. 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 Patient with Fairly recent dx of diabetes, on basal-bolus insulin regimen, with again worse control since last visit. At last visit, we added metformin, in hopes to be able to reduce his insulin. However, he could not tolerate a higher dose, and is now trying to stay on the lowest dose. Sugars are not controlled, higher before then after meals.  - At today's visit, we checked sugars with this meter and our meter and they correlated very well (CBG 265 fasting with our meter) - Therefore, we cannot reduce his insulin at this point (and actually we need to increase his basal insulin since his preprandial sugars are quite high), but we discussed about adding iNVOKAmet XR since  this contains Glumetza, which is a form of metformin very well tolerated, but also has the SGLT 2 inhibitor. If he tolerates this well, increase to 2 tablets daily ([phone] milligrams) - we discussed about SEs of Invokana, which are: dizziness (advised to be careful when stands from sitting position), decreased BP - usually not < normal (BP today is not low), and fungal UTIs (advised to let me know if develops one).  - given discount card for Invokana - I suggested to:  Patient Instructions  Please increase Tresiba to 36 units daily (may need more: 40 units if sugars are still high before meals).  Please continue: - Novolog: - 1:10 with b'fast and lunch - 1:8 with dinner Start: - InvokaMet ER 50-500 mg with b'fast. If you tolerate this well, increase to 2 tablets.  Please return in 3 months with your sugar log.   - continue checking sugars at different times of the day - check at least 3 times a day, rotating checks >> he is doing a great job with this, brings detailed records - advised for yearly eye exams >> he is up-to-date -We'll check his annual labs right now  - HbA1c today: 6.4% (stable). We'll also check a fructosamine since the sugars in his log are higher than the measured HbA1c. - Return in about 3 months (around 11/02/2016).  2. Peripheral neuropathy  - his symptoms of peripheral neuropathy are much better - a B12 vitamin, which was low normal >> we started on oral supplement of B12 >> continues with this - we then added Neurontin 100 mg bid >> continues with this - we then added alpha Lipoic acid at last visit >> he felt much better after we added this and he continues  - time spent with the patient: 40 min, of which >50% was spent in reviewing his sugar logs, insulin doses, previous labs, discussing his and hyper-glycemic episodes, and developing a plan to avoid hypo- and hyper-glycemia.  Office Visit on 08/03/2016  Component Date Value Ref Range Status  . Microalb, Ur  08/03/2016 1.6  0.0 - 1.9 mg/dL Final  . Creatinine,U 16/01/9603 140.8  mg/dL Final  . Microalb Creat Ratio 08/03/2016 1.1  0.0 - 30.0 mg/g Final  . Sodium 08/03/2016 138  135 - 146 mmol/L Final  . Potassium 08/03/2016 4.7  3.5 - 5.3 mmol/L Final  . Chloride 08/03/2016 102  98 - 110 mmol/L Final  . CO2 08/03/2016 26  20 - 31 mmol/L Final  . Glucose, Bld 08/03/2016 203* 65 - 99 mg/dL Final  . BUN 54/12/8117 10  7 - 25 mg/dL Final  . Creat 14/78/2956 0.89  0.60 - 1.35 mg/dL Final  . Total Bilirubin 08/03/2016 0.6  0.2 - 1.2 mg/dL Final  . Alkaline Phosphatase 08/03/2016 72  40 - 115 U/L Final  . AST 08/03/2016 32  10 - 40 U/L Final  . ALT 08/03/2016 51* 9 - 46 U/L Final  . Total Protein 08/03/2016 7.5  6.1 - 8.1 g/dL Final  . Albumin 21/30/8657 4.6  3.6 - 5.1 g/dL Final  . Calcium 84/69/6295 9.7  8.6 - 10.3 mg/dL Final  . GFR, Est African American 08/03/2016 >89  >=60 mL/min Final  . GFR, Est Non African American 08/03/2016 >89  >=60 mL/min Final  . Cholesterol 08/03/2016 193  0 - 200 mg/dL Final  . Triglycerides 08/03/2016 97.0  0.0 - 149.0 mg/dL Final  . HDL 28/41/3244 65.60  >39.00 mg/dL Final  . VLDL 04/14/7251 19.4  0.0 - 40.0 mg/dL Final  . LDL Cholesterol 08/03/2016 108* 0 - 99 mg/dL Final  . Total CHOL/HDL Ratio 08/03/2016 3   Final  . NonHDL 08/03/2016 127.50   Final  . TSH 08/03/2016 3.12  0.35 - 4.50 uIU/mL Final  . Fructosamine 08/03/2016 327* 190 - 270 umol/L Final  . Hemoglobin A1C 08/03/2016 6.4   Final   HbA1c calculated from the fructosamine is 7.17%. ALT slightly high. We'll advised to follow-up on this with PCP.  Carlus Pavlov, MD PhD [facility]

## 2016-08-04 LAB — COMPLETE METABOLIC PANEL WITH GFR
ALBUMIN: 4.6 g/dL (ref 3.6–5.1)
ALK PHOS: 72 U/L (ref 40–115)
ALT: 51 U/L — AB (ref 9–46)
AST: 32 U/L (ref 10–40)
BUN: 10 mg/dL (ref 7–25)
CHLORIDE: 102 mmol/L (ref 98–110)
CO2: 26 mmol/L (ref 20–31)
CREATININE: 0.89 mg/dL (ref 0.60–1.35)
Calcium: 9.7 mg/dL (ref 8.6–10.3)
GFR, Est African American: >89 mL/min (ref >=60)
GFR, Est Non African American: >89 mL/min (ref >=60)
GLUCOSE: 203 mg/dL — AB (ref 65–99)
POTASSIUM: 4.7 mmol/L (ref 3.5–5.3)
Sodium: 138 mmol/L (ref 135–146)
Total Bilirubin: 0.6 mg/dL (ref 0.2–1.2)
Total Protein: 7.5 g/dL (ref 6.1–8.1)

## 2016-08-05 LAB — FRUCTOSAMINE: Fructosamine: 327 umol/L — ABNORMAL HIGH (ref 190–270)

## 2016-08-08 ENCOUNTER — Encounter: Payer: Self-pay | Admitting: Internal Medicine

## 2016-08-10 ENCOUNTER — Other Ambulatory Visit: Payer: Self-pay | Admitting: Internal Medicine

## 2016-08-10 ENCOUNTER — Encounter: Payer: Self-pay | Admitting: Internal Medicine

## 2016-08-10 MED ORDER — EMPAGLIFLOZIN 10 MG PO TABS: 10.0000 mg | ORAL_TABLET | Freq: Every day | ORAL | 2 refills | Status: DC

## 2016-08-11 ENCOUNTER — Other Ambulatory Visit: Payer: Self-pay

## 2016-08-11 NOTE — Telephone Encounter (Signed)
Invokamet XR not covered, Dr.Gherghe submitted Jardiance to see if this covered.

## 2016-08-30 ENCOUNTER — Encounter: Payer: Self-pay | Admitting: Internal Medicine

## 2016-08-31 ENCOUNTER — Other Ambulatory Visit: Payer: Self-pay

## 2016-08-31 MED ORDER — INSULIN ASPART 100 UNIT/ML FLEXPEN: 4.0000 [IU] | PEN_INJECTOR | Freq: Three times a day (TID) | SUBCUTANEOUS | 1 refills | Status: DC

## 2016-09-22 ENCOUNTER — Other Ambulatory Visit: Payer: Self-pay | Admitting: Internal Medicine

## 2016-11-14 ENCOUNTER — Other Ambulatory Visit: Payer: Self-pay | Admitting: Internal Medicine

## 2016-11-17 ENCOUNTER — Ambulatory Visit: Payer: BC Managed Care – PPO | Admitting: Internal Medicine

## 2016-12-11 ENCOUNTER — Other Ambulatory Visit: Payer: Self-pay | Admitting: Internal Medicine

## 2016-12-21 ENCOUNTER — Other Ambulatory Visit: Payer: Self-pay | Admitting: Internal Medicine

## 2017-01-12 ENCOUNTER — Encounter: Payer: Self-pay | Admitting: Internal Medicine

## 2017-01-12 ENCOUNTER — Ambulatory Visit (INDEPENDENT_AMBULATORY_CARE_PROVIDER_SITE_OTHER): Payer: BC Managed Care – PPO | Admitting: Internal Medicine

## 2017-01-12 VITALS — BP 124/82 | HR 77 | Temp 98.5°F | Wt 215.0 lb

## 2017-01-12 DIAGNOSIS — Z23 Encounter for immunization: Secondary | ICD-10-CM

## 2017-01-12 DIAGNOSIS — E1142 Type 2 diabetes mellitus with diabetic polyneuropathy: Secondary | ICD-10-CM

## 2017-01-12 DIAGNOSIS — E1149 Type 2 diabetes mellitus with other diabetic neurological complication: Secondary | ICD-10-CM

## 2017-01-12 DIAGNOSIS — IMO0001 Reserved for inherently not codable concepts without codable children: Secondary | ICD-10-CM

## 2017-01-12 DIAGNOSIS — Z794 Long term (current) use of insulin: Secondary | ICD-10-CM | POA: Diagnosis not present

## 2017-01-12 LAB — BASIC METABOLIC PANEL WITH GFR
BUN: 15 mg/dL (ref 7–25)
CO2: 27 mmol/L (ref 20–32)
CREATININE: 0.9 mg/dL (ref 0.60–1.35)
Calcium: 9.6 mg/dL (ref 8.6–10.3)
Chloride: 102 mmol/L (ref 98–110)
GFR, EST AFRICAN AMERICAN: 129 mL/min/{1.73_m2} (ref >=60)
GFR, EST NON AFRICAN AMERICAN: 111 mL/min/{1.73_m2} (ref >=60)
Glucose, Bld: 149 mg/dL — ABNORMAL HIGH (ref 65–99)
POTASSIUM: 4.6 mmol/L (ref 3.5–5.3)
SODIUM: 138 mmol/L (ref 135–146)

## 2017-01-12 LAB — POCT GLYCOSYLATED HEMOGLOBIN (HGB A1C): Hemoglobin A1C: 6.2

## 2017-01-12 MED ORDER — EMPAGLIFLOZIN 10 MG PO TABS: 10.0000 mg | ORAL_TABLET | Freq: Every day | ORAL | 11 refills | Status: DC

## 2017-01-12 MED ORDER — METFORMIN HCL ER 500 MG PO TB24: 500.0000 mg | ORAL_TABLET | Freq: Two times a day (BID) | ORAL | 5 refills | Status: DC

## 2017-01-12 NOTE — Addendum Note (Signed)
Addended by: Roena Malady on: 01/12/2017 10:19 AM   Modules accepted: Orders

## 2017-01-12 NOTE — Patient Instructions (Addendum)
Please continue:  - Jardiance 10 mg daily  - Tresiba 36 units daily - Novolog: - 1:10 with b'fast and lunch - 1:8 with dinner  Try to add Metformin ER 500 mg with dinner. If you tolerate this well, try to add a second dose with dinner or b'fast.  Please stop at the lab.  To stop the Neurontin, try to taper it down to off.  Please return in 3 months with your sugar log.

## 2017-01-12 NOTE — Addendum Note (Signed)
Addended by: Roena Malady on: 01/12/2017 10:23 AM   Modules accepted: Orders

## 2017-01-12 NOTE — Progress Notes (Addendum)
Patient ID: Greg Goodman, male   DOB: 06-12-1980, 36 y.o.   MRN: [id number]  HPI: Greg Goodman is a 36 y.o.-year-old male, returning for follow-up for DM2, dx in 05/2015, insulin-dependent, uncontrolled, with complications (peripheral neuropathy). Last visit 5.5 mo ago.  At last visit, he gained more weight and complains of increased sweating.  Reviewed and addended hx: In 05/2014 >> started to feel poorly, with dizziness, lightheadedness, and chest pressure. Retrospectively, he also had increased thirst, increased urination, increased hunger with unintentional weight loss (40 lbs in 1 year!), blurry vision.  He went to UC >> CBG: "HI" >> sent to ED: 561. He was hospitalized and then discharged on insulin.  He was in the ED 2x after the initial admission >> weak, blurry vision - 06/02/2015 (Non-ketotic Hgly - 200-300). Then, he was in the ED (High Pt Reg) 06/17/2015 >> same sxs (Non-ketotic Hgly - 200s).   Since then, he remained on a basal-bolus insulin regimen.   He saw Pincus Large for carb counting teaching. He feels comfortable with this now.    We checked him for type 1 diabetes twice and the investigation was negative.  Last hemoglobin A1c was: 08/03/2016: HbA1c calculated from the fructosamine is 7.17%. Lab Results  Component Value Date   HGBA1C 6.4 08/03/2016   HGBA1C 6.3 05/04/2016   HGBA1C 5.3 02/05/2016   He is on: - InvokaMet ER 50-500 mg with  >> Jardiance 10 mg daily b/c insurance preference  - Tresiba 36 units daily - Novolog: - 1:10 with b'fast and lunch - 1:8 with dinner If you exercise within 2h after a meal, take 1 unit less with that meal. If you exercise after 2h from a meal, take a 15g carb snack. He tried Meformin >> bloating, AP   Pt checks his sugars 3-4x a day and they are (per his detailed log): - am:   92-229, 241 >> 141-265, 285 >> 100-168, 177 - 2h after b'fast: 199 >> 60, 62 >> n/c >> 161, 169 >> n/c - before lunch: 105-276, 301 >> 120-279  >> 117-178, 225, 230 - 2h after lunch:  48-90 >> n/c >> 110, 141 >> 153 - before dinner:  147-198, 247 >> 130-245 >> 111-161 - 2h after dinner: 95-125, 252 >> 142 >> 179 >> 196 >> 127-171 - bedtime:  74-156, 169 >> n/c - nighttime: n/c160 Lowest sugar was 110 >> 100; ? hypoglycemia awareness.  Highest sugar was 320 (no tresiba) >> 230.  Glucometer: One[employer]  Pt's meals are: - Breakfast: 2 eggs + English muffin or toast or cereals (6 units) - Lunch: sandwich wrap or salad (4 units) - Dinner: chicken + salad + rice (6 units)  - Snacks: rice cakes Diet sodas.  - No CKD, last BUN/creatinine:  Lab Results  Component Value Date   BUN 10 08/03/2016   CREATININE 0.89 08/03/2016   - last set of lipids: Lab Results  Component Value Date   CHOL 193 08/03/2016   HDL 65.60 08/03/2016   LDLCALC 108 (H) 08/03/2016   TRIG 97.0 08/03/2016   CHOLHDL 3 08/03/2016   - last eye exam was: 01/2016 >> No DR: Abstract on 01/21/2016  Component Date Value Ref Range Status  . HM Diabetic Eye Exam 01/21/2016 No Retinopathy  No Retinopathy Final    - he has numbness and tingling in his feet On Neurontin 100 mg 2x a day. He would like to come off.  On ALA >> feeling better on this.  ROS: Constitutional: +  weight gain/no weight loss, no fatigue, no subjective hyperthermia, no subjective hypothermia, + Increased sweating Eyes: no blurry vision, no xerophthalmia ENT: no sore throat, no nodules palpated in throat, no dysphagia, no odynophagia, no hoarseness Cardiovascular: no CP/no SOB/no palpitations/no leg swelling Respiratory: no cough/no SOB/no wheezing Gastrointestinal: no N/no V/no D/no C/no acid reflux Musculoskeletal: no muscle aches/no joint aches Skin: no rashes, no hair loss Neurological: no tremors/+ numbness/+ tingling/no dizziness  I reviewed pt's medications, allergies, PMH, social hx, family hx, and changes were documented in the history of present illness. Otherwise,  unchanged from my initial visit note.   Past Medical History:  Diagnosis Date  . Anxiety   . Depression   . Diabetes mellitus without complication (HCC) 05/2015   NEW ONSET   Past Surgical History:  Procedure Laterality Date  . APPENDECTOMY     Social History   Social History  . Marital Status: Single    Spouse Name: N/A  . Number of Children: 0   Occupational History  .  English teacher    Social History Main Topics  . Smoking status: Former Smoker -- 0.25 packs/day - quit 2017  . Smokeless tobacco: Never Used  . Alcohol Use: Yes     Comment: occ  . Drug Use: No  . Sexual Activity: Yes   Current Outpatient Prescriptions on File Prior to Visit  Medication Sig Dispense Refill  . gabapentin (NEURONTIN) 100 MG capsule TAKE 1 CAPSULE(100 MG) BY MOUTH TWICE DAILY 180 capsule 0  . glucose blood (ONETOUCH VERIO) test strip Use as instructed to check sugar 4 times daily 400 each 5  . ibuprofen (ADVIL,MOTRIN) 400 MG tablet Take 400 mg by mouth every 6 (six) hours as needed for mild pain.    . Insulin Degludec (TRESIBA FLEXTOUCH) 200 UNIT/ML SOPN Inject 36 Units into the skin daily. 9 pen 5  . Insulin Pen Needle 31G X 6 MM MISC 1 Device by Does not apply route 2 (two) times daily. 60 each 3  . JARDIANCE 10 MG TABS tablet TAKE 1 TABLET BY MOUTH DAILY 30 tablet 0  . NOVOLOG FLEXPEN 100 UNIT/ML FlexPen ADMINISTER 4 TO 10 UNITS UNDER THE SKIN THREE TIMES DAILY WITH MEALS 15 mL 11   No current facility-administered medications on file prior to visit.    No Known Allergies   FH: - Maternal aunt with diabetes type 2, insulin-dependent - No known family history of autoimmune diseases.  PE: BP 124/82   Pulse 77   Temp 98.5 F (36.9 C) (Oral)   Wt 215 lb (97.5 kg)   SpO2 98%   BMI 27.60 kg/m  Body mass index is 27.6 kg/m. Wt Readings from Last 3 Encounters:  01/12/17 215 lb (97.5 kg)  08/03/16 208 lb (94.3 kg)  05/04/16 198 lb (89.8 kg)   Constitutional: overweight, in  NAD Eyes: PERRLA, EOMI, no exophthalmos ENT: moist mucous membranes, no thyromegaly, no cervical lymphadenopathy Cardiovascular: RRR, No MRG Respiratory: CTA B Gastrointestinal: abdomen soft, NT, ND, BS+ Musculoskeletal: no deformities, strength intact in all 4 Skin: moist, warm, no rashes Neurological: no tremor with outstretched hands, DTR normal in all 4  ASSESSMENT: 1. DM2, insulin-dependent, with complications - Peripheral neuropathy  Component     Latest Ref Rng 05/29/2015  Glutamic Acid Decarb Ab     0.0 - 5.0 U/mL <5.0   Component     Latest Ref Rng 06/25/2015  C-Peptide     0.80-3.85 ng/mL 0.87  Glucose, Fasting  65 - 99 mg/dL 161 (H)  Pancreatic Islet Cell Antibody     < 5 JDF Units <5  Vitamin B12     211 - 911 pg/mL 261  Anti-pancreatic antibodies negative. C-peptide low normal. He may have a degree of insulin deficiency, but this was not clearly evident...  Component     Latest Ref Rng & Units 02/05/2016  Hemoglobin A1C      5.3  C-Peptide     0.80 - 3.85 ng/mL 1.33  Glucose, Fasting     65 - 99 mg/dL 97   Labs again indicated good insulin production...  2. 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 Patient with Relatively recent diagnosis of diabetes, on basal-bolus insulin regimen, with improved control after adding Jardiance after last visit. However, his sugars in his carefully kept log are still above target. We discussed about addition of metformin ER, as he had GI side effects with a regular metformin. He agrees to try this. If or he cannot tolerate it, we can increase to Jardiance dose  or try a GLP-1 receptor agonist  - His sugar at home are higher than target while his HbA1c here is usually quite low. HbA1c calculated from a fructosamine checked at last visit was 7.17%, lower than the measured 6.4%.  At this visit, his HbA1c is even better, at 6.2%. We will not check a fructosamine at this visit. At last visit, we checked his sugar with his meter and hours and they  correlated well...  - I also advised him to try to lose weight  - I suggested to:  Patient Instructions  Please continue:  - Jardiance 10 mg daily  - Tresiba 36 units daily - Novolog: - 1:10 with b'fast and lunch - 1:8 with dinner  Try to add Metformin ER 500 mg with dinner. If you tolerate this well, try to add a second dose with dinner or b'fast.  Please stop at the lab.  To stop the Neurontin, try to taper it down to off.  Please return in 3 months with your sugar log.    - today, HbA1c is 6.2% (better)  - continue checking sugars at different times of the day - check 2-3x a day, rotating checks - advised for yearly eye exams >> he is UTD - will give him the flu shot today - We will check a BMP today since we started Jardiance at last visit - Return to clinic in 3 mo with sugar log   2. Peripheral neuropathy  - his symptoms of peripheral neuropathy are much better, so that he would like to come off Neurontin. I advised him to try to taper it down, rather than stopping it abruptly.  - a B12 vitamin, which was low normal >> we started on oral supplement of B12 >> continues with this - we also added alpha lipoic acid >> he feels much better after he started this   Component     Latest Ref Rng & Units 01/12/2017          Glucose     65 - 99 mg/dL 096 (H)  BUN     7 - 25 mg/dL 15  Creatinine     0.45 - 1.35 mg/dL 4.09  GFR, Est Non African American     > OR = 60 mL/min/1.45m2 111  GFR, Est African American     > OR = 60 mL/min/1.67m2 129  BUN/Creatinine Ratio     6 - 22 (calc) NOT APPLICABLE  Sodium     135 - 146 mmol/L 138  Potassium     3.5 - 5.3 mmol/L 4.6  Chloride     98 - 110 mmol/L 102  CO2     20 - 32 mmol/L 27  Calcium     8.6 - 10.3 mg/dL 9.6  Hemoglobin W0J      6.2   Labs are normal >> we can continue Jardiance.  Carlus Pavlov, MD PhD [facility] Endocrin[facility]

## 2017-04-14 ENCOUNTER — Ambulatory Visit: Payer: BC Managed Care – PPO | Admitting: Internal Medicine

## 2017-04-14 ENCOUNTER — Encounter: Payer: Self-pay | Admitting: Internal Medicine

## 2017-04-14 VITALS — BP 128/80 | HR 94 | Ht 74.0 in | Wt 219.0 lb

## 2017-04-14 DIAGNOSIS — Z794 Long term (current) use of insulin: Secondary | ICD-10-CM

## 2017-04-14 DIAGNOSIS — E1149 Type 2 diabetes mellitus with other diabetic neurological complication: Secondary | ICD-10-CM

## 2017-04-14 DIAGNOSIS — IMO0001 Reserved for inherently not codable concepts without codable children: Secondary | ICD-10-CM

## 2017-04-14 DIAGNOSIS — E1142 Type 2 diabetes mellitus with diabetic polyneuropathy: Secondary | ICD-10-CM

## 2017-04-14 LAB — POCT GLYCOSYLATED HEMOGLOBIN (HGB A1C): Hemoglobin A1C: 6

## 2017-04-14 MED ORDER — DULAGLUTIDE 0.75 MG/0.5ML ~~LOC~~ SOAJ: SUBCUTANEOUS | 1 refills | Status: DC

## 2017-04-14 NOTE — Patient Instructions (Addendum)
Please continue:  - Metformin ER 500 mg 2x a day with meals  - Jardiance 10 mg daily  - Tresiba 36 units daily  Please decrease: - Novolog: - 1:15 with b'fast and lunch - 1:10 with dinner  Please start Trulicity 0.75 mg weekly. Let me know when you are close to running out to call in the higher dose to your pharmacy (1.5 mg).  Please return in 3 months with your sugar log.

## 2017-04-14 NOTE — Progress Notes (Signed)
Patient ID: Greg Goodman, male   DOB: 01-27-1981, 37 y.o.   MRN: [id number]  HPI: Greg Goodman is a 37 y.o.-year-old male, returning for follow-up for DM2, dx in 05/2015, insulin-dependent, uncontrolled, with complications (peripheral neuropathy). Last visit 5.5 mo ago.  At last visit, he gained more weight and complains of increased sweating.  Reviewed and addended history: In 05/2014 >> started to feel poorly, with dizziness, lightheadedness, and chest pressure. Retrospectively, he also had increased thirst, increased urination, increased hunger with unintentional weight loss (40 lbs in 1 year!), blurry vision.  He went to UC >> CBG: "HI" >> sent to ED: 561. He was hospitalized and then discharged on insulin.  He was in the ED 2x after the initial admission >> weak, blurry vision - 06/02/2015 (Non-ketotic Hgly - 200-300). Then, he was in the ED (High Pt Reg) 06/17/2015 >> same sxs (Non-ketotic Hgly - 200s).   Since then, he remained on a basal/bolus insulin regimen   He saw Pincus Large for carb counting teaching.  He feels comfortable with this.   We checked him for type 1 diabetes 2x and the investigation was negative.  Last hemoglobin A1c was:  Lab Results  Component Value Date   HGBA1C 6.2 01/12/2017   HGBA1C 6.4 08/03/2016   HGBA1C 6.3 05/04/2016  08/03/2016: HbA1c calculated from the fructosamine is 7.17%.  He is on: - InvokaMet ER 50-500 mg with  >> Jardiance 10 mg daily b/c insurance preference - Metformin 500 mg twice a day added at last visit >> tolerates this well  - Tresiba 36 units daily - Novolog: - 1:10with b'fast and lunch - 1:8 with dinner If you exercise within 2h after a meal, take 1 unit less with that meal. If you exercise after 2h from a meal, take a 15g carb snack. He tried Meformin >> bloating, AP   Pt checks his sugars 3-4x a day and they are (per his detailed log) -however, he feels that his meter is malfunctioning as he occasionally has a 400  or 600 CBG without feeling differently and with normal sugars when he rechecks them. - am:   92-229, 241 >> 141-265, 285 >> 100-168, 177 >> 95-185, 220 - 2h after b'fast: 199 >> 60, 62 >> n/c >> 161, 169 >> 69 - before lunch: 105-276, 301 >> 120-279 >> 117-178, 225, 230 >> 82-169 - 2h after lunch:  48-90 >> n/c >> 110, 141 >> 153 >> 85 - before dinner:  147-198, 247 >> 130-245 >> 111-161 >> 92-164, 259 - 2h after dinner: 95-125, 252 >> 142 >> 179 >> 196 >> 127-171 > 114-121 - bedtime:  74-156, 169 >> n/c - nighttime: n/c160 Lowest sugar was 110 >> 100 >> 69; ? hypoglycemia awareness.  Highest sugar was 320 (no tresiba) >> 230 >> 220.  Glucometer: [employer] IQ  Pt's meals are: - Breakfast: 2 eggs + English muffin or toast or cereals (6 units) - Lunch: sandwich wrap or salad (4 units) - Dinner: chicken + salad + rice (6 units)  - Snacks: rice cakes Diet sodas.  - No CKD, last BUN/creatinine:  Lab Results  Component Value Date   BUN 15 01/12/2017   CREATININE 0.90 01/12/2017   - + HL; last set of lipids: Lab Results  Component Value Date   CHOL 193 08/03/2016   HDL 65.60 08/03/2016   LDLCALC 108 (H) 08/03/2016   TRIG 97.0 08/03/2016   CHOLHDL 3 08/03/2016   - last eye exam was: 01/2016 >>  No DR - + numbness and tingling in his feet He was off Neurontin 100 mg 2x a day >> stopped since last visit On ALA  >> feeling better on this.  He was on B12 1000 mcg daily.   ROS: Constitutional: no weight gain/no weight loss, no fatigue, no subjective hyperthermia, no subjective hypothermia Eyes: no blurry vision, no xerophthalmia ENT: no sore throat, no nodules palpated in throat, no dysphagia, no odynophagia, no hoarseness Cardiovascular: no CP/no SOB/no palpitations/no leg swelling Respiratory: no cough/no SOB/no wheezing Gastrointestinal: no N/no V/no D/no C/no acid reflux Musculoskeletal: no muscle aches/no joint aches Skin: no rashes, no hair loss Neurological: no  tremors/no numbness/no tingling/no dizziness  I reviewed pt's medications, allergies, PMH, social hx, family hx, and changes were documented in the history of present illness. Otherwise, unchanged from my initial visit note.   Past Medical History:  Diagnosis Date  . Anxiety   . Depression   . Diabetes mellitus without complication (HCC) 05/2015   NEW ONSET   Past Surgical History:  Procedure Laterality Date  . APPENDECTOMY     Social History   Social History  . Marital Status: Single    Spouse Name: N/A  . Number of Children: 0   Occupational History  .  English teacher    Social History Main Topics  . Smoking status: Former Smoker -- 0.25 packs/day - quit 2017  . Smokeless tobacco: Never Used  . Alcohol Use: Yes     Comment: occ  . Drug Use: No  . Sexual Activity: Yes   Current Outpatient Medications on File Prior to Visit  Medication Sig Dispense Refill  . empagliflozin (JARDIANCE) 10 MG TABS tablet Take 10 mg by mouth daily. 30 tablet 11  . gabapentin (NEURONTIN) 100 MG capsule TAKE 1 CAPSULE(100 MG) BY MOUTH TWICE DAILY 180 capsule 0  . glucose blood (ONETOUCH VERIO) test strip Use as instructed to check sugar 4 times daily 400 each 5  . ibuprofen (ADVIL,MOTRIN) 400 MG tablet Take 400 mg by mouth every 6 (six) hours as needed for mild pain.    . Insulin Degludec (TRESIBA FLEXTOUCH) 200 UNIT/ML SOPN Inject 36 Units into the skin daily. 9 pen 5  . Insulin Pen Needle 31G X 6 MM MISC 1 Device by Does not apply route 2 (two) times daily. 60 each 3  . metFORMIN (GLUCOPHAGE-XR) 500 MG 24 hr tablet Take 1 tablet (500 mg total) by mouth 2 (two) times daily with a meal. 60 tablet 5  . NOVOLOG FLEXPEN 100 UNIT/ML FlexPen ADMINISTER 4 TO 10 UNITS UNDER THE SKIN THREE TIMES DAILY WITH MEALS 15 mL 11   No current facility-administered medications on file prior to visit.    No Known Allergies   FH: - Maternal aunt with diabetes type 2, insulin-dependent - No known family  history of autoimmune diseases.  PE: BP 128/80   Pulse 94   Ht 6\' 2"  (1.88 m)   Wt 219 lb (99.3 kg)   SpO2 96%   BMI 28.12 kg/m  Body mass index is 28.12 kg/m. Wt Readings from Last 3 Encounters:  04/14/17 219 lb (99.3 kg)  01/12/17 215 lb (97.5 kg)  08/03/16 208 lb (94.3 kg)   Constitutional: overweight, in NAD Eyes: PERRLA, EOMI, no exophthalmos ENT: moist mucous membranes, no thyromegaly, no cervical lymphadenopathy Cardiovascular: tachycardia, RR, No MRG Respiratory: CTA B Gastrointestinal: abdomen soft, NT, ND, BS+ Musculoskeletal: no deformities, strength intact in all 4 Skin: moist, warm, no rashes Neurological:  no tremor with outstretched hands, DTR normal in all 4  ASSESSMENT: 1. DM2, insulin-dependent, with complications - Peripheral neuropathy  Component     Latest Ref Rng 05/29/2015  Glutamic Acid Decarb Ab     0.0 - 5.0 U/mL <5.0   Component     Latest Ref Rng 06/25/2015  C-Peptide     0.80-3.85 ng/mL 0.87  Glucose, Fasting     65 - 99 mg/dL 161161 (H)  Pancreatic Islet Cell Antibody     < 5 JDF Units <5  Vitamin B12     211 - 911 pg/mL 261  Anti-pancreatic antibodies negative. C-peptide low normal. He may have a degree of insulin deficiency, but this was not clearly evident...  Component     Latest Ref Rng & Units 02/05/2016  Hemoglobin A1C      5.3  C-Peptide     0.80 - 3.85 ng/mL 1.33  Glucose, Fasting     65 - 99 mg/dL 97   Labs again indicated good insulin production...  2. 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 Patient with fairly recent diagnosis of diabetes, on basal-bolus insulin regimen, + Jardiance and now metformin ER, which he is tolerating well.  His sugars have improved since last visit, but he still has occasional spikes.  He is suspecting that his meter is not completely accurate.  We will give him a new one. - We checked him for type 1 diabetes twice in the investigation was negative. - At this visit, in an effort to decrease his mealtime insulin  and hopefully even come off, we will start Trulicity and I advised him to adjust down the NovoLog doses while on this.  However, since he has sugars after meals that are lower than before meals, we will go ahead and reduce the NovoLog dose now.  At next visit, I am hoping we can also reduced [place]. - I suggested to:  Patient Instructions  Please continue:  - Metformin ER 500 mg 2x a day with meals  - Jardiance 10 mg daily  - Tresiba 36 units daily  Please decrease: - Novolog: - 1:15 with b'fast and lunch - 1:10 with dinner  Please start Trulicity 0.75 mg weekly. Let me know when you are close to running out to call in the higher dose to your pharmacy (1.5 mg).  Please return in 3 months with your sugar log.     - today, HbA1c is 6% (better) - continue checking sugars at different times of the day - check 3x a day, rotating checks - advised for yearly eye exams >> he is UTD - Return to clinic in 3 mo with sugar log   2. Peripheral neuropathy  - Patient symptoms of peripheral neuropathy improved dramatically after starting alpha lipoic acid so he could now come off Neurontin. - We reviewed together his B12 vitamin obtained last year, which was low normal >> he was taking a B12 supplement of 1000 mcg daily but stopped several months ago.  Since he is complaining of disequilibrium and also due to his peripheral neuropathy, will recheck the level and I strongly advised him to stay on the supplement -I will advised him about the dose when the results are back - Continue alpha lipoic acid  Office Visit on 04/14/2017  Component Date Value Ref Range Status  . Vitamin B-12 04/14/2017 383  211 - 911 pg/mL Final  . Hemoglobin A1C 04/14/2017 6   Final   Msg sent: Dear Cletis AthensJon, Vitamin  B12 is a little better than before, but I believe you would still benefit from a supplement of 1000 mcg daily. Sincerely, Carlus Pavlov MD  Carlus Pavlov, MD PhD [facility] Endocrin[facility]

## 2017-04-15 LAB — VITAMIN B12: VITAMIN B 12: 383 pg/mL (ref 211–911)

## 2017-05-05 ENCOUNTER — Encounter: Payer: Self-pay | Admitting: Internal Medicine

## 2017-05-09 ENCOUNTER — Other Ambulatory Visit: Payer: Self-pay | Admitting: Internal Medicine

## 2017-05-09 MED ORDER — DULAGLUTIDE 1.5 MG/0.5ML ~~LOC~~ SOAJ: SUBCUTANEOUS | 11 refills | Status: DC

## 2017-05-12 IMAGING — CR DG CHEST 2V
2 series · 2 of 2 positions shown · non-contrast
Comparison: None

CLINICAL DATA: Intermittent chest pain for 2 days, elevated blood
sugar, smoker

EXAM:
CHEST  2 VIEW

[w chest pa]
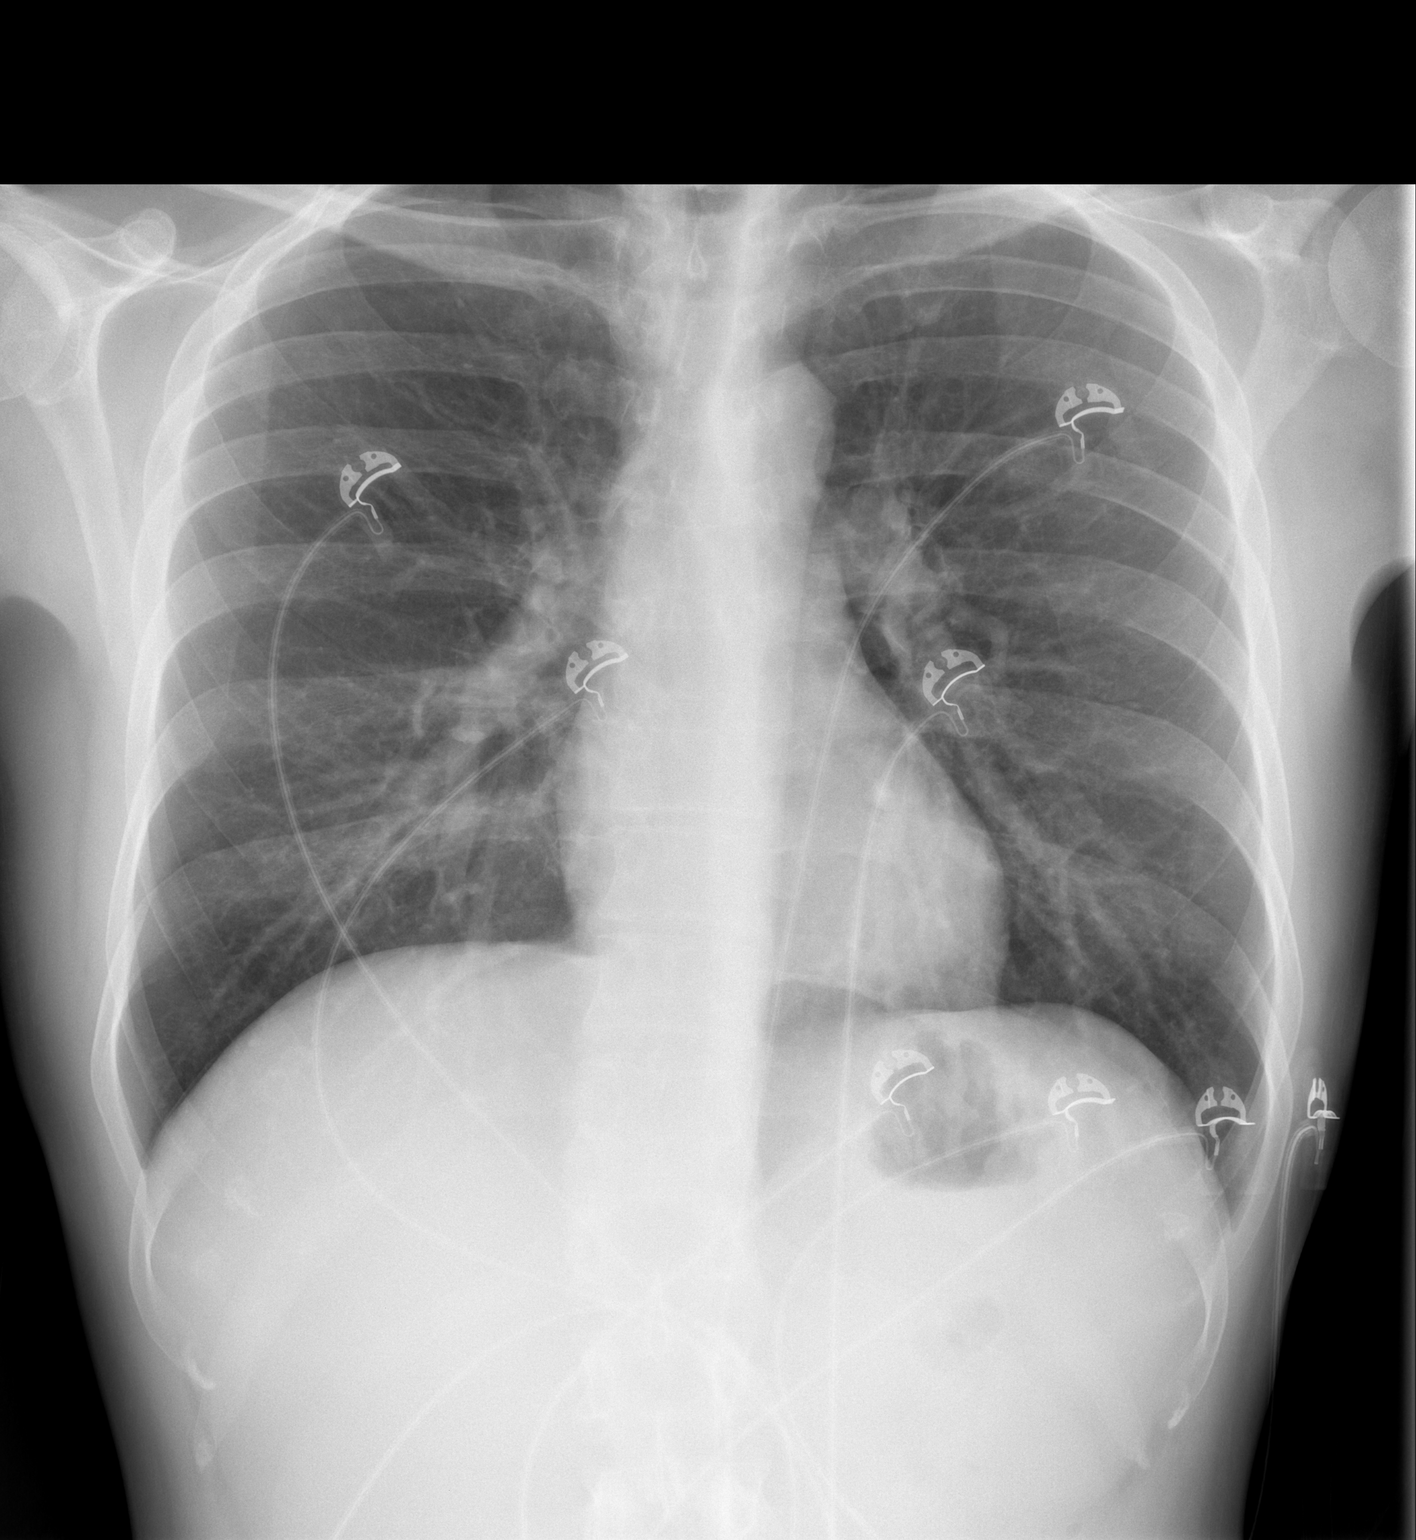

[w chest lat]
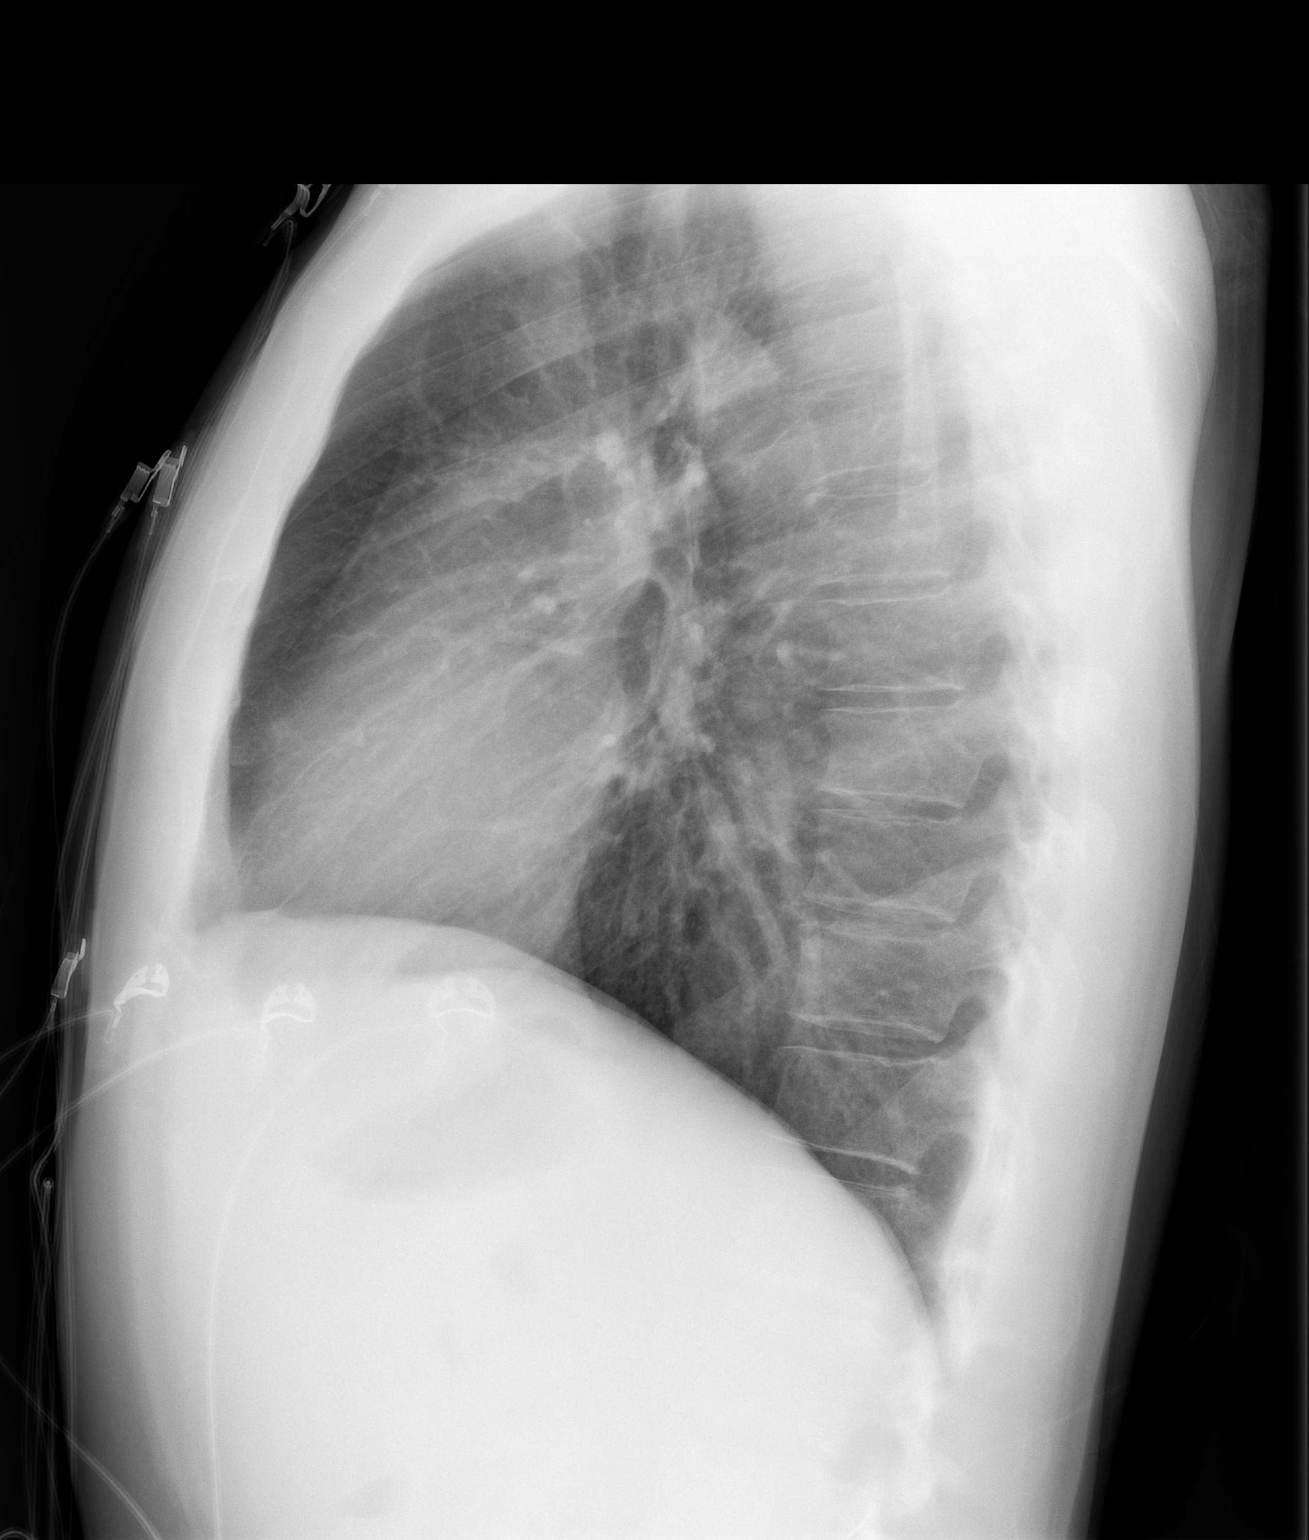

[2 of 2 positions shown; findings below may reference images not displayed]

FINDINGS: Normal heart size, mediastinal contours, and pulmonary vascularity.

Lungs clear.

No pneumothorax.

Bones unremarkable.
IMPRESSION: Normal exam.

## 2017-06-13 ENCOUNTER — Ambulatory Visit (HOSPITAL_COMMUNITY)
Admission: EM | Admit: 2017-06-13 | Discharge: 2017-06-13 | Discharge status: Against Medical Advice | Payer: Worker's Compensation

## 2017-07-14 ENCOUNTER — Encounter: Payer: Self-pay | Admitting: Internal Medicine

## 2017-07-14 ENCOUNTER — Ambulatory Visit: Payer: BC Managed Care – PPO | Admitting: Internal Medicine

## 2017-07-14 VITALS — BP 108/78 | HR 93 | Ht 74.0 in | Wt 208.8 lb

## 2017-07-14 DIAGNOSIS — Z794 Long term (current) use of insulin: Secondary | ICD-10-CM

## 2017-07-14 DIAGNOSIS — E1142 Type 2 diabetes mellitus with diabetic polyneuropathy: Secondary | ICD-10-CM

## 2017-07-14 DIAGNOSIS — E538 Deficiency of other specified B group vitamins: Secondary | ICD-10-CM | POA: Diagnosis not present

## 2017-07-14 DIAGNOSIS — E1149 Type 2 diabetes mellitus with other diabetic neurological complication: Secondary | ICD-10-CM | POA: Diagnosis not present

## 2017-07-14 DIAGNOSIS — IMO0001 Reserved for inherently not codable concepts without codable children: Secondary | ICD-10-CM

## 2017-07-14 LAB — POCT GLYCOSYLATED HEMOGLOBIN (HGB A1C): Hemoglobin A1C: 6

## 2017-07-14 MED ORDER — INSULIN DEGLUDEC 200 UNIT/ML ~~LOC~~ SOPN: 32.0000 [IU] | PEN_INJECTOR | Freq: Every day | SUBCUTANEOUS | 5 refills | Status: DC

## 2017-07-14 NOTE — Patient Instructions (Addendum)
Please continue:  - Metformin ER 500 mg 2x a day with meals  - Jardiance 10 mg daily  - Trulicity 1.5 mg weekly  - Tresiba 32 units daily  Stop Novolog.  Please return in 3-4 months with your sugar log.

## 2017-07-14 NOTE — Progress Notes (Signed)
Patient ID: Greg Goodman, male   DOB: 1980/07/28, 37 y.o.   MRN: [id number]  HPI: Greg Goodman is a 37 y.o.-year-old male, returning for follow-up for DM2, dx in 05/2015, insulin-dependent, uncontrolled, with complications (peripheral neuropathy). Last visit 3 mo ago.  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  He had a URI since last visit.  Sugars are higher then, some in the 200s.  They have now decreased.  Reviewed hx: In 05/2014 >> started to feel poorly, with dizziness, lightheadedness, and chest pressure. Retrospectively, he also had increased thirst, increased urination, increased hunger with unintentional weight loss (40 lbs in 1 year!), blurry vision.  He went to UC >> CBG: "HI" >> sent to ED: 561. He was hospitalized and then discharged on insulin.  He was in the ED 2x after the initial admission >> weak, blurry vision - 06/02/2015 (Non-ketotic Hgly - 200-300). Then, he was in the ED (High Pt Reg) 06/17/2015 >> same sxs (Non-ketotic Hgly - 200s).   Since then, he remained on a basal/bolus insulin regimen   He saw Pincus Large for carb counting teaching.  He feels comfortable with this.   We checked him for type 1 diabetes 2x and the investigation was negative.  Last hemoglobin A1c was: Lab Results  Component Value Date   HGBA1C 6 04/14/2017   HGBA1C 6.2 01/12/2017   HGBA1C 6.4 08/03/2016  08/03/2016: HbA1c calculated from the fructosamine is 7.17%.  He is on: - Metformin ER 500 mg 2x a day with meals  - Jardiance 10 mg daily  - Trulicity 1.5 mg weekly - started GLP1R agonist 04/2017  - Tresiba 36 units daily >> 32 units at night - Novolog: - 1:15 with b'fast and lunch >> now 4 units per meal - 1:10 with dinner >> now 4 units per meal If you exercise within 2h after a meal, take 1 unit less with that meal. If you exercise after 2h  from a meal, take a 15g carb snack. He tried Meformin >> bloating, AP   Pt checks his sugars 3-4x a day >> reviewed his log: - am:   100-168, 177 >> 95-185, 220 >> 91-163 (forgot [place]), 200-297 when sick - 2h after b'fast: 60, 62 >> n/c >> 161, 169 >> 69 - before lunch: 117-178, 225, 230 >> 82-169 >> 98-152 - 2h after lunch: 110, 141 >> 153 >> 85 >> n/c - before dinner: 111-161 >> 92-164, 259 >> 93-145, 130-195 when sick - 2h after dinner:196 >> 127-171 > 114-121 >> n/c - bedtime:  74-156, 169 >> n/c - nighttime: n/c160 Lowest sugar was 69 >>  63; hypoglycemia awareness at 80s. Highest sugar was 220 >> 297.  Glucometer: [employer] IQ  Pt's meals are: - Breakfast: 2 eggs + English muffin or toast or cereals (6 units) - Lunch: sandwich wrap or salad (4 units) - Dinner: chicken + salad + rice (6 units)  - Snacks: rice cakes + Diet sodas.  - No CKD, last BUN/creatinine:  Lab Results  Component Value Date   BUN 15 01/12/2017   CREATININE 0.90 01/12/2017   -+ HL; last set of lipids: Lab Results  Component Value Date   CHOL 193 08/03/2016   HDL 65.60 08/03/2016   LDLCALC 108 (H) 08/03/2016   TRIG 97.0 08/03/2016   CHOLHDL 3 08/03/2016  Not on a statin. - last eye exam was: 01/2016: No DR - Improved numbness and tingling in his feet He was off Neurontin 100 mg 2x a day >> now off On ALA  >> symptoms are much better on this  We restarted B12 1000 mcg daily at last visit as his B12 was close to the lower limit of normal: Lab Results  Component Value Date   VITAMINB12 383 04/14/2017   VITAMINB12 261 06/25/2015   ROS: Constitutional: + weight loss, no fatigue, no subjective hyperthermia, no subjective hypothermia Eyes: no blurry vision, no xerophthalmia ENT: no sore throat, no nodules palpated in throat, no dysphagia, no odynophagia, no hoarseness Cardiovascular: no CP/no SOB/no palpitations/no leg swelling Respiratory: no cough/no SOB/no wheezing Gastrointestinal:  no N/no V/no D/no C/no acid reflux Musculoskeletal: no muscle aches/no joint aches Skin: no rashes, no hair loss Neurological: no tremors/no numbness/no tingling/no dizziness  I reviewed pt's medications, allergies, PMH, social hx, family hx, and changes were documented in the history of present illness. Otherwise, unchanged from my initial visit note.   Past Medical History:  Diagnosis Date  . Anxiety   . Depression   . Diabetes mellitus without complication (HCC) 05/2015   NEW ONSET   Past Surgical History:  Procedure Laterality Date  . APPENDECTOMY     Social History   Social History  . Marital Status: Single    Spouse Name: N/A  . Number of Children: 0   Occupational History  .  English teacher    Social History Main Topics  . Smoking status: Former Smoker -- 0.25 packs/day - quit 2017  . Smokeless tobacco: Never Used  . Alcohol Use: Yes     Comment: occ  . Drug Use: No  . Sexual Activity: Yes   Current Outpatient Medications on File Prior to Visit  Medication Sig Dispense Refill  . Dulaglutide (TRULICITY) 1.5 MG/0.5ML SOPN Inject 1.5 mg weekly under skin 4 pen 11  . empagliflozin (JARDIANCE) 10 MG TABS tablet Take 10 mg by mouth daily. 30 tablet 11  . gabapentin (NEURONTIN) 100 MG capsule TAKE 1 CAPSULE(100 MG) BY MOUTH TWICE DAILY 180 capsule 0  . glucose blood (ONETOUCH VERIO) test strip Use as instructed to check sugar 4 times daily 400 each 5  . ibuprofen (ADVIL,MOTRIN) 400 MG tablet Take 400 mg by mouth every 6 (six) hours as needed for mild pain.    . Insulin Degludec (TRESIBA FLEXTOUCH) 200 UNIT/ML SOPN Inject 36 Units into the skin daily. 9 pen 5  . Insulin Pen Needle 31G X 6 MM MISC 1 Device by Does not apply route 2 (two) times daily. 60 each 3  . metFORMIN (GLUCOPHAGE-XR) 500 MG 24 hr tablet Take 1 tablet (500 mg total) by mouth 2 (two) times daily with a meal. 60 tablet 5  . NOVOLOG FLEXPEN 100 UNIT/ML FlexPen ADMINISTER 4 TO 10 UNITS UNDER THE SKIN  THREE TIMES DAILY WITH MEALS 15 mL 11   No current facility-administered medications on file prior to visit.    No Known Allergies   FH: - Maternal aunt with diabetes type 2, insulin-dependent - No known family history of autoimmune diseases.  PE: BP 108/78   Pulse 93   Ht 6\' 2"  (1.88 m)   Wt 208 lb 12.8 oz (94.7 kg)   SpO2 98%   BMI 26.81 kg/m  Body mass index is 26.81 kg/m. Wt Readings from Last 3 Encounters:  07/14/17 208 lb 12.8 oz (94.7 kg)  04/14/17 219  lb (99.3 kg)  01/12/17 215 lb (97.5 kg)   Constitutional: overweight, in NAD Eyes: PERRLA, EOMI, no exophthalmos ENT: moist mucous membranes, no thyromegaly, no cervical lymphadenopathy Cardiovascular: tachycardia, RR, No MRG Respiratory: CTA B Gastrointestinal: abdomen soft, NT, ND, BS+ Musculoskeletal: no deformities, strength intact in all 4 Skin: moist, warm, no rashes Neurological: no tremor with outstretched hands, DTR normal in all 4  ASSESSMENT: 1. DM2, insulin-dependent, with complications - Peripheral neuropathy  Component     Latest Ref Rng 05/29/2015  Glutamic Acid Decarb Ab     0.0 - 5.0 U/mL <5.0   Component     Latest Ref Rng 06/25/2015  C-Peptide     0.80-3.85 ng/mL 0.87  Glucose, Fasting     65 - 99 mg/dL 161161 (H)  Pancreatic Islet Cell Antibody     < 5 JDF Units <5  Vitamin B12     211 - 911 pg/mL 261  Anti-pancreatic antibodies negative. C-peptide low normal. He may have a degree of insulin deficiency, but this was not clearly evident...  Component     Latest Ref Rng & Units 02/05/2016  Hemoglobin A1C      5.3  C-Peptide     0.80 - 3.85 ng/mL 1.33  Glucose, Fasting     65 - 99 mg/dL 97   Labs again indicated good insulin production...  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 Low B12  PLAN:  1. Patient with fairly recent diagnosis of diabetes, on basal-bolus insulin regimen, Jardiance, metformin ER, and GLP-1 receptor agonist added at last visit.  He was having occasional hyperglycemic spikes then  despite improved overall blood sugars.  We started a GLP-1 receptor agonist in an effort to hopefully transition him off at least the mealtime insulin, since his labs were consistently indicating type II, rather than type I diabetes.  I was also hoping that this would help him lose some weight. I did advise him to reduce his NovoLog doses when we started Trulicity. - Since last visit, he feels much better, and he is only taking 4 units of NovoLog with meals.  He also was able to decrease his [place]iba to 32 units daily.  His sugars was higher when he had an URI, but now they improved. - He is tolerating Trulicity well, but initially had nausea and diarrhea for a few days after the second dose. - We will continue Trulicity and I will advised him to stop NovoLog completely.  I also advised him to decrease [place]iba if he can - I suggested to:  Patient Instructions  Please continue:  - Metformin ER 500 mg 2x a day with meals  - Jardiance 10 mg daily  - Trulicity 1.5 mg weekly  - Tresiba 32 units daily  Stop Novolog.  Please return in 3-4 months with your sugar log.     - today, HbA1c is 6% (stable, great!) - Again discussed about targets for CBGs before and after meals: For him, 80-120 before meals and less than 150 ideally after meals - continue checking sugars at different times of the day - check 3-4x a day, rotating checks - advised for yearly eye exams >> he is not UTD, but has an appointment coming up - We will check annual labs at next visit - Return to clinic in 3-4 mo with sugar log    2. Peripheral neuropathy  -His symptoms improved dramatically after starting alpha lipoic acid so he could come off Neurontin -We will continue alpha lipoic acid and  B12 supplement  3.  low B12 - At last visit, we check another B12 vitamin since he was complaining about imbalance - B12 was in the 300s so I advised him to restart his 1000 mcg daily B12 supplement. He has less balance pbs on this.  - Plan  to recheck another B12 level at next visit  Carlus Pavlov, MD PhD [facility] Endocrinology

## 2017-08-22 ENCOUNTER — Other Ambulatory Visit: Payer: Self-pay | Admitting: Internal Medicine

## 2017-09-17 ENCOUNTER — Other Ambulatory Visit: Payer: Self-pay | Admitting: Internal Medicine

## 2017-10-23 ENCOUNTER — Other Ambulatory Visit: Payer: Self-pay | Admitting: Internal Medicine

## 2017-11-30 ENCOUNTER — Other Ambulatory Visit: Payer: Self-pay | Admitting: Internal Medicine

## 2017-12-01 ENCOUNTER — Other Ambulatory Visit: Payer: Self-pay | Admitting: Internal Medicine

## 2017-12-02 ENCOUNTER — Ambulatory Visit: Payer: BC Managed Care – PPO | Admitting: Internal Medicine

## 2017-12-02 ENCOUNTER — Encounter: Payer: Self-pay | Admitting: Internal Medicine

## 2017-12-02 VITALS — BP 118/86 | HR 83 | Ht 73.0 in | Wt 202.0 lb

## 2017-12-02 DIAGNOSIS — E538 Deficiency of other specified B group vitamins: Secondary | ICD-10-CM | POA: Diagnosis not present

## 2017-12-02 DIAGNOSIS — Z794 Long term (current) use of insulin: Secondary | ICD-10-CM | POA: Diagnosis not present

## 2017-12-02 DIAGNOSIS — E1149 Type 2 diabetes mellitus with other diabetic neurological complication: Secondary | ICD-10-CM

## 2017-12-02 DIAGNOSIS — E1142 Type 2 diabetes mellitus with diabetic polyneuropathy: Secondary | ICD-10-CM | POA: Diagnosis not present

## 2017-12-02 DIAGNOSIS — IMO0001 Reserved for inherently not codable concepts without codable children: Secondary | ICD-10-CM

## 2017-12-02 LAB — COMPLETE METABOLIC PANEL WITH GFR
AG Ratio: 1.7 (calc) (ref 1.0–2.5)
ALT: 43 U/L (ref 9–46)
AST: 21 U/L (ref 10–40)
Albumin: 5 g/dL (ref 3.6–5.1)
Alkaline phosphatase (APISO): 69 U/L (ref 40–115)
BILIRUBIN TOTAL: 0.8 mg/dL (ref 0.2–1.2)
BUN: 22 mg/dL (ref 7–25)
CHLORIDE: 102 mmol/L (ref 98–110)
CO2: 25 mmol/L (ref 20–32)
Calcium: 10.3 mg/dL (ref 8.6–10.3)
Creat: 0.96 mg/dL (ref 0.60–1.35)
GFR, EST AFRICAN AMERICAN: 118 mL/min/{1.73_m2} (ref >=60)
GFR, Est Non African American: 102 mL/min/{1.73_m2} (ref >=60)
GLOBULIN: 2.9 g/dL (ref 1.9–3.7)
Glucose, Bld: 99 mg/dL (ref 65–99)
POTASSIUM: 4.8 mmol/L (ref 3.5–5.3)
Sodium: 139 mmol/L (ref 135–146)
TOTAL PROTEIN: 7.9 g/dL (ref 6.1–8.1)

## 2017-12-02 LAB — POCT GLYCOSYLATED HEMOGLOBIN (HGB A1C): Hemoglobin A1C: 6.1 % — AB (ref 4.0–5.6)

## 2017-12-02 LAB — MICROALBUMIN / CREATININE URINE RATIO
Creatinine,U: 82 mg/dL
Microalb Creat Ratio: 1.2 mg/g (ref 0.0–30.0)
Microalb, Ur: 1 mg/dL (ref 0.0–1.9)

## 2017-12-02 LAB — LIPID PANEL
CHOLESTEROL: 180 mg/dL (ref 0–200)
HDL: 44 mg/dL (ref >39.00)
LDL CALC: 115 mg/dL — AB (ref 0–99)
NonHDL: 135.88
Total CHOL/HDL Ratio: 4
Triglycerides: 103 mg/dL (ref 0.0–149.0)
VLDL: 20.6 mg/dL (ref 0.0–40.0)

## 2017-12-02 LAB — VITAMIN B12: Vitamin B-12: 946 pg/mL — ABNORMAL HIGH (ref 211–911)

## 2017-12-02 LAB — TSH: TSH: 1.01 u[IU]/mL (ref 0.35–4.50)

## 2017-12-02 MED ORDER — GLUCOSE BLOOD VI STRP: ORAL_STRIP | 5 refills | Status: DC

## 2017-12-02 MED ORDER — METFORMIN HCL ER 500 MG PO TB24: ORAL_TABLET | ORAL | 3 refills | Status: DC

## 2017-12-02 NOTE — Progress Notes (Signed)
Patient ID: Greg Goodman, male   DOB: [birth year], 37 y.o.   MRN: [id number]  HPI: Greg Goodman is a 37 y.o.-year-old male, returning for follow-up for DM2, dx in 05/2015, insulin-dependent, uncontrolled, with complications (peripheral neuropathy). Last visit 4.5 months ago.  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  Sugars are slightly higher (spikes): 200, seldom, not in last 2 mo. Sugars have improved after he started school this week.  Reviewed history: In 05/2014 >> started to feel poorly, with dizziness, lightheadedness, and chest pressure. Retrospectively, he also had increased thirst, increased urination, increased hunger with unintentional weight loss (40 lbs in 1 year!), blurry vision.  He went to UC >> CBG: "HI" >> sent to ED: 561. He was hospitalized and then discharged on insulin.  He was in the ED 2x after the initial admission >> weak, blurry vision - 06/02/2015 (Non-ketotic Hgly - 200-300). Then, he was in the ED (High Pt Reg) 06/17/2015 >> same sxs (Non-ketotic Hgly - 200s).   Since then, he remained on a basal/bolus insulin regimen   He saw Pincus Large for carb counting teaching.  He feels comfortable with this.   We checked him for type 1 diabetes 2x and the investigation was negative.  Last hemoglobin A1c was: Lab Results  Component Value Date   HGBA1C 6.0% 07/14/2017   HGBA1C 6 04/14/2017   HGBA1C 6.2 01/12/2017  08/03/2016: HbA1c calculated from the fructosamine is 7.17%.  He is on: - Metformin ER 500 mg 2x a day with meals  - Jardiance 10 mg daily  - Trulicity 1.5 mg weekly -started 04/2017  - Tresiba 36 units daily  He was previously also on: - Novolog: - 1:15 with b'fast and lunch >> now 4 units per meal - 1:10 with dinner >> now 4 units per meal If you exercise within 2h after a meal, take 1 unit less with that meal. If you exercise after  2h from a meal, take a 15g carb snack. He tried regular Meformin >> bloating, AP  Pt checks his sugars 3 times a day, per review of his carefully kept log: - am: 95-185, 220 >> 91-163, 200-297 when sick >> 93-133, 141 - 2h after b'fast: 60, 62 >> n/c >> 161, 169 >> 69 >> n/c - before lunch: 117-178, 225, 230 >> 82-169 >> 98-152 >> 79, 119-148 - 2h after lunch: 110, 141 >> 153 >> 85 >> n/c  - before dinner:  92-164, 259 >> 93-145, 130-195 >> 121-151 - 2h after dinner:196 >> 127-171 > 114-121 >> n/c - bedtime:  74-156, 169 >> n/c - nighttime: n/c160 Lowest sugar was 69 >>  63 >> 79; hypoglycemia awareness in the 80s. Highest sugar was 220 >> 297 >> 229.  Glucometer: [employer] IQ  Pt's meals are: - Breakfast: 2 eggs + English muffin or toast or cereals (6 units) - Lunch: sandwich wrap or salad (4 units) - Dinner: chicken + salad + rice (6 units)  - Snacks: rice cakes He does drink diet sodas.  - No CKD, last BUN/creatinine:  Lab Results  Component Value Date   BUN 15 01/12/2017   CREATININE 0.90 01/12/2017   -+ HL; last set of lipids: Lab Results  Component Value Date   CHOL 193 08/03/2016   HDL 65.60 08/03/2016   LDLCALC 108 (H) 08/03/2016   TRIG 97.0 08/03/2016   CHOLHDL 3 08/03/2016  Not  on a statin - last eye exam was: 2019: No DR - Improved numbness and tingling in his feet He is now off Neurontin. On alpha lipoic acid and B12.  Symptoms are much better on this.  In 04/2017, we restarted B12 1000 mcg daily. Lab Results  Component Value Date   VITAMINB12 383 04/14/2017   VITAMINB12 261 06/25/2015   ROS: Constitutional: no weight gain/no weight loss, no fatigue, no subjective hyperthermia, no subjective hypothermia Eyes: no blurry vision, no xerophthalmia ENT: no sore throat, no nodules palpated in throat, no dysphagia, no odynophagia, no hoarseness Cardiovascular: no CP/no SOB/no palpitations/no leg swelling Respiratory: no cough/no SOB/no  wheezing Gastrointestinal: no N/no V/no D/no C/no acid reflux Musculoskeletal: no muscle aches/no joint aches Skin: no rashes, no hair loss, + itching Neurological: no tremors/no numbness/no tingling/no dizziness  I reviewed pt's medications, allergies, PMH, social hx, family hx, and changes were documented in the history of present illness. Otherwise, unchanged from my initial visit note.   Past Medical History:  Diagnosis Date  . Anxiety   . Depression   . Diabetes mellitus without complication (HCC) 05/2015   NEW ONSET   Past Surgical History:  Procedure Laterality Date  . APPENDECTOMY     Social History   Social History  . Marital Status: Single    Spouse Name: N/A  . Number of Children: 0   Occupational History  .  English teacher    Social History Main Topics  . Smoking status: Former Smoker -- 0.25 packs/day - quit 2017  . Smokeless tobacco: Never Used  . Alcohol Use: Yes     Comment: occ  . Drug Use: No  . Sexual Activity: Yes   Current Outpatient Medications on File Prior to Visit  Medication Sig Dispense Refill  . Dulaglutide (TRULICITY) 1.5 MG/0.5ML SOPN Inject 1.5 mg weekly under skin 4 pen 11  . empagliflozin (JARDIANCE) 10 MG TABS tablet Take 10 mg by mouth daily. 30 tablet 11  . glucose blood (ONETOUCH VERIO) test strip Use as instructed to check sugar 4 times daily 400 each 5  . ibuprofen (ADVIL,MOTRIN) 400 MG tablet Take 400 mg by mouth every 6 (six) hours as needed for mild pain.    . Insulin Degludec (TRESIBA FLEXTOUCH) 200 UNIT/ML SOPN Inject 32 Units into the skin daily. 9 pen 5  . Insulin Pen Needle 31G X 6 MM MISC 1 Device by Does not apply route 2 (two) times daily. 60 each 3  . metFORMIN (GLUCOPHAGE-XR) 500 MG 24 hr tablet TAKE 1 TABLET(500 MG) BY MOUTH TWICE DAILY WITH A MEAL 60 tablet 0  . TRESIBA FLEXTOUCH 200 UNIT/ML SOPN INJECT 36 UNITS INTO THE SKIN EVERY DAY 9 pen 0   No current facility-administered medications on file prior to visit.     No Known Allergies   FH: - Maternal aunt with diabetes type 2, insulin-dependent - No known family history of autoimmune diseases.  PE: BP 118/86   Pulse 83   Ht 6\' 1"  (1.854 m)   Wt 202 lb (91.6 kg)   SpO2 98%   BMI 26.65 kg/m  Body mass index is 26.65 kg/m. Wt Readings from Last 3 Encounters:  12/02/17 202 lb (91.6 kg)  07/14/17 208 lb 12.8 oz (94.7 kg)  04/14/17 219 lb (99.3 kg)   Constitutional: overweight, in NAD Eyes: PERRLA, EOMI, no exophthalmos ENT: moist mucous membranes, no thyromegaly, no cervical lymphadenopathy Cardiovascular: RRR, No MRG Respiratory: CTA B Gastrointestinal: abdomen soft, NT, ND, BS+  Musculoskeletal: no deformities, strength intact in all 4 Skin: moist, warm, no rashes Neurological: no tremor with outstretched hands, DTR normal in all 4  ASSESSMENT: 1. DM2, insulin-dependent, with complications - Peripheral neuropathy  Component     Latest Ref Rng 05/29/2015  Glutamic Acid Decarb Ab     0.0 - 5.0 U/mL <5.0   Component     Latest Ref Rng 06/25/2015  C-Peptide     0.80-3.85 ng/mL 0.87  Glucose, Fasting     65 - 99 mg/dL [id number] (H)  Pancreatic Islet Cell Antibody     < 5 JDF Units <5  Vitamin B12     211 - 911 pg/mL 261  Anti-pancreatic antibodies negative. C-peptide low normal. He may have a degree of insulin deficiency, but this was not clearly evident...  Component     Latest Ref Rng & Units 02/05/2016  Hemoglobin A1C      5.3  C-Peptide     0.80 - 3.85 ng/mL 1.33  Glucose, Fasting     65 - 99 mg/dL 97   Labs again indicated good insulin production...  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 Low B12  PLAN:  1. Patient with insulin-dependent type 2 diabetes, with good control recently on a regimen containing metformin ER, Jardiance, Trulicity, and Tresiba.  He continues to have occasional hyperglycemic spikes but overall, his sugars are at goal.  We checked him for type 1 diabetes twice and the investigation was negative. - He is tolerating  Trulicity well, but initially had nausea and diarrhea, which have now resolved - At last visit, we stopped NovoLog completely as he was only taking 4 units of NovoLog with meals.  He was also able to reduce his Evaristo Buryresiba dose before last visit, however, at this visit, he is back to taking 36 units daily - On the current regimen, his sugars are mostly at goal in the morning and occasionally slightly higher before lunch and before dinner.  We discussed that if he has a snack before this meals, sugars will be slightly high.  However, it is clear that he does not need to add back NovoLog, especially since he usually has better sugars when he works, and he felt that he did not do as well over the summer.  He started school at the beginning of this week and I can already see the sugars improving. - I suggested to:  Patient Instructions  Please continue:  - Metformin ER 500 mg 2x a day with meals  - Jardiance 10 mg daily  - Trulicity 1.5 mg weekly  - Tresiba 36 units daily  Please return in 4 months with your sugar log.     - today, HbA1c is 6.1% (slightly higher) - continue checking sugars at different times of the day - check 1x a day, rotating checks - advised for yearly eye exams >> he is UTD - We will check his annual labs today - Return to clinic in 4 mo with sugar log    2. Peripheral neuropathy  -His symptoms improved significantly after he started alpha lipoic acid, so he could come off Neurontin -Continue alpha lipoic acid and B12  3.  low B12 -He continues on 1000 mcg daily B12 supplement.  He feels that his balance problems improved on this. -We will check a B12 level today  Carlus Pavlovristina Janayia Burggraf, MD PhD [facility]

## 2017-12-02 NOTE — Patient Instructions (Signed)
Please continue:  - Metformin ER 500 mg 2x a day with meals  - Jardiance 10 mg daily  - Trulicity 1.5 mg weekly  - Tresiba 36 units daily  Please stop at the lab.  Please return in 4 months with your sugar log.

## 2017-12-05 ENCOUNTER — Encounter: Payer: Self-pay | Admitting: Internal Medicine

## 2017-12-10 ENCOUNTER — Other Ambulatory Visit: Payer: Self-pay | Admitting: Internal Medicine

## 2018-01-17 ENCOUNTER — Other Ambulatory Visit: Payer: Self-pay

## 2018-01-17 ENCOUNTER — Encounter: Payer: Self-pay | Admitting: Internal Medicine

## 2018-01-17 MED ORDER — INSULIN PEN NEEDLE 31G X 6 MM MISC: 1.0000 | Freq: Two times a day (BID) | 3 refills | Status: DC

## 2018-02-06 ENCOUNTER — Other Ambulatory Visit: Payer: Self-pay | Admitting: Internal Medicine

## 2018-03-14 ENCOUNTER — Encounter: Payer: Self-pay | Admitting: Internal Medicine

## 2018-03-14 MED ORDER — ONETOUCH DELICA LANCETS FINE MISC: 2 refills | Status: DC

## 2018-04-06 ENCOUNTER — Other Ambulatory Visit: Payer: Self-pay | Admitting: Internal Medicine

## 2018-04-07 ENCOUNTER — Ambulatory Visit: Payer: Self-pay | Admitting: Internal Medicine

## 2018-04-08 ENCOUNTER — Other Ambulatory Visit: Payer: Self-pay | Admitting: Internal Medicine

## 2018-04-21 ENCOUNTER — Other Ambulatory Visit: Payer: Self-pay | Admitting: Internal Medicine

## 2018-06-13 ENCOUNTER — Other Ambulatory Visit: Payer: Self-pay | Admitting: Internal Medicine

## 2018-06-29 ENCOUNTER — Other Ambulatory Visit: Payer: Self-pay | Admitting: Internal Medicine

## 2018-06-29 ENCOUNTER — Encounter: Payer: Self-pay | Admitting: Internal Medicine

## 2018-06-29 MED ORDER — INSULIN ASPART 100 UNIT/ML FLEXPEN: 0.0000 [IU] | PEN_INJECTOR | Freq: Three times a day (TID) | SUBCUTANEOUS | 11 refills | Status: DC

## 2018-07-03 ENCOUNTER — Other Ambulatory Visit: Payer: Self-pay | Admitting: Internal Medicine

## 2018-07-11 ENCOUNTER — Encounter: Payer: Self-pay | Admitting: Internal Medicine

## 2018-07-11 ENCOUNTER — Ambulatory Visit: Payer: Self-pay | Admitting: Internal Medicine

## 2018-07-11 ENCOUNTER — Ambulatory Visit: Payer: BC Managed Care – PPO | Admitting: Internal Medicine

## 2018-07-11 ENCOUNTER — Other Ambulatory Visit: Payer: Self-pay

## 2018-07-11 VITALS — BP 120/82 | HR 82 | Temp 98.2°F | Ht 73.0 in | Wt 208.0 lb

## 2018-07-11 DIAGNOSIS — IMO0001 Reserved for inherently not codable concepts without codable children: Secondary | ICD-10-CM

## 2018-07-11 DIAGNOSIS — E538 Deficiency of other specified B group vitamins: Secondary | ICD-10-CM

## 2018-07-11 DIAGNOSIS — E1142 Type 2 diabetes mellitus with diabetic polyneuropathy: Secondary | ICD-10-CM

## 2018-07-11 DIAGNOSIS — Z794 Long term (current) use of insulin: Secondary | ICD-10-CM | POA: Diagnosis not present

## 2018-07-11 DIAGNOSIS — E1149 Type 2 diabetes mellitus with other diabetic neurological complication: Secondary | ICD-10-CM | POA: Diagnosis not present

## 2018-07-11 LAB — POCT GLYCOSYLATED HEMOGLOBIN (HGB A1C): HEMOGLOBIN A1C: 6.6 % — AB (ref 4.0–5.6)

## 2018-07-11 MED ORDER — EMPAGLIFLOZIN 25 MG PO TABS: 25.0000 mg | ORAL_TABLET | Freq: Every day | ORAL | 3 refills | Status: DC

## 2018-07-11 MED ORDER — ONETOUCH DELICA LANCETS 33G MISC: 3 refills | Status: DC

## 2018-07-11 NOTE — Progress Notes (Signed)
Patient ID: Greg Goodman, male   DOB: Jul 29, 1980, 38 y.o.   MRN: [id number]  HPI: Greg Goodman is a 38 y.o.-year-old male, returning for follow-up for DM2, dx in 05/2015, insulin-dependent, uncontrolled, with complications (peripheral neuropathy). Last visit 7 months ago.  He had to restart the NovoLog since last visit (~4 U for correction) as sugars were higher since he is less active.  He also had more dietary indiscretions lately.  He had an upper respiratory infection twice since last visit and sugars were higher at that time.  Reviewed history: In 05/2014 >> started to feel poorly, with dizziness, lightheadedness, and chest pressure. Retrospectively, he also had increased thirst, increased urination, increased hunger with unintentional weight loss (40 lbs in 1 year!), blurry vision.  He went to UC >> CBG: "HI" >> sent to ED: 561. He was hospitalized and then discharged on insulin.  He was in the ED 2x after the initial admission >> weak, blurry vision - 06/02/2015 (Non-ketotic Hgly - 200-300). Then, he was in the ED (High Pt Reg) 06/17/2015 >> same sxs (Non-ketotic Hgly - 200s).   Since then, he remained on a basal/bolus insulin regimen   He saw Pincus Large for carb counting teaching.  He feels comfortable with this.   We checked him for type 1 diabetes 2x and the investigation was negative.  Last hemoglobin A1c was: Lab Results  Component Value Date   HGBA1C 6.1 (A) 12/02/2017   HGBA1C 6.0% 07/14/2017   HGBA1C 6 04/14/2017  08/03/2016: HbA1c calculated from the fructosamine is 7.17%.  He is on: - Metformin ER 500 mg 2x a day with meals  - Jardiance 10 mg daily  - Trulicity 1.5 mg weekly -started 04/2017-tolerated well  - Tresiba 36 units daily - Novolog 2-6 units for correction He tried regular Meformin >> bloating, AP  Pt checks his sugars 3 times a day per his carefully kept Log- sugars appear slightly higher. - am: 91-163, 200-297 >> 93-133, 141 >> 98-134, 150,  190-200 if forgets [place] - 2h after b'fast: 60, 62 >> n/c >> 161, 169 >> 69 >> n/c >> 121-233 (sick) - before lunch: 82-169 >> 98-152 >> 79, 119-148 >> 92-197, 217 - 2h after lunch: 110, 141 >> 153 >> 85 >> n/c >> 277 (sick) - before dinner:  92-164, 259 >> 93-145, 130-195 >> 121-151 >> 105-157, 182 - 2h after dinner:196 >> 127-171 > 114-121 >> n/c - bedtime:  74-156, 169 >> n/c >> 117 - nighttime: n/c160 Lowest sugar was 69 >>  63 >> 79 >> 98; hypoglycemia awareness in the 80s. Highest sugar was 220 >> 297 >> 229 >> 277.  Glucometer: [employer] IQ  Pt's meals are: - Breakfast: 2 eggs + English muffin or toast or cereals (6 units) - Lunch: sandwich wrap or salad (4 units) - Dinner: chicken + salad + rice (6 units)  - Snacks: rice cakes He drinks diet sodas.  -No CKD, last BUN/creatinine:  Lab Results  Component Value Date   BUN 22 12/02/2017   CREATININE 0.96 12/02/2017   -+ HL; last set of lipids: Lab Results  Component Value Date   CHOL 180 12/02/2017   HDL 44.00 12/02/2017   LDLCALC 115 (H) 12/02/2017   TRIG 103.0 12/02/2017   CHOLHDL 4 12/02/2017  He is not on a statin - last eye exam was 2019: No DR -Slight numbness and tingling in his feet He was on Neurontin, now off. He is on alpha-lipoic acid and B12 supplement.  Symptoms are much better on this combination.  In 04/2017, we started B12 1000 mcg daily.  Latest vitamin B12 was not low: Lab Results  Component Value Date   VITAMINB12 946 (H) 12/02/2017   VITAMINB12 383 04/14/2017   VITAMINB12 261 06/25/2015   ROS: Constitutional: no weight gain/no weight loss, no fatigue, no subjective hyperthermia, no subjective hypothermia Eyes: no blurry vision, no xerophthalmia ENT: no sore throat, no nodules palpated in neck, no dysphagia, no odynophagia, no hoarseness Cardiovascular: no CP/no SOB/no palpitations/no leg swelling Respiratory: no cough/no SOB/no wheezing Gastrointestinal: no N/no V/no D/no C/no acid  reflux Musculoskeletal: no muscle aches/no joint aches Skin: no rashes, no hair loss Neurological: no tremors/no numbness/no tingling/no dizziness  I reviewed pt's medications, allergies, PMH, social hx, family hx, and changes were documented in the history of present illness. Otherwise, unchanged from my initial visit note.   Past Medical History:  Diagnosis Date  . Anxiety   . Depression   . Diabetes mellitus without complication (HCC) 05/2015   NEW ONSET   Past Surgical History:  Procedure Laterality Date  . APPENDECTOMY     Social History   Social History  . Marital Status: Single    Spouse Name: N/A  . Number of Children: 0   Occupational History  .  English teacher    Social History Main Topics  . Smoking status: Former Smoker -- 0.25 packs/day - quit 2017  . Smokeless tobacco: Never Used  . Alcohol Use: Yes     Comment: occ  . Drug Use: No  . Sexual Activity: Yes   Current Outpatient Medications on File Prior to Visit  Medication Sig Dispense Refill  . glucose blood (ONETOUCH VERIO) test strip Use as instructed to check sugar 4 times daily 400 each 5  . ibuprofen (ADVIL,MOTRIN) 400 MG tablet Take 400 mg by mouth every 6 (six) hours as needed for mild pain.    Marland Kitchen insulin aspart (NOVOLOG FLEXPEN) 100 UNIT/ML FlexPen Inject 0-6 Units into the skin 3 (three) times daily before meals. 15 mL 11  . Insulin Pen Needle 31G X 6 MM MISC 1 Device by Does not apply route 2 (two) times daily. 60 each 3  . JARDIANCE 10 MG TABS tablet TAKE 1 TABLET BY MOUTH DAILY 30 tablet 1  . metFORMIN (GLUCOPHAGE-XR) 500 MG 24 hr tablet TAKE 1 TABLET(500 MG) BY MOUTH TWICE DAILY WITH A MEAL 180 tablet 3  . ONETOUCH DELICA LANCETS FINE MISC Use to check blood sugar 3 times a day 300 each 2  . ONETOUCH VERIO test strip TEST FOUR TIMES DAILY AS DIRECTED 400 each 0  . TRESIBA FLEXTOUCH 200 UNIT/ML SOPN INJECT 36 UNITS INTO THE SKIN DAILY 9 mL 2  . TRULICITY 1.5 MG/0.5ML SOPN INJECT CONTENTS OF 1  SYRINGE UNDER THE SKIN ONCE A WEEK 2 mL 1   No current facility-administered medications on file prior to visit.    No Known Allergies   FH: - Maternal aunt with diabetes type 2, insulin-dependent - No known family history of autoimmune diseases.  PE: BP 120/82   Pulse 82   Temp 98.2 F (36.8 C)   Ht 6\' 1"  (1.854 m)   Wt 208 lb (94.3 kg)   SpO2 98%   BMI 27.44 kg/m  Body mass index is 27.44 kg/m. Wt Readings from Last 3 Encounters:  07/11/18 208 lb (94.3 kg)  12/02/17 202 lb (91.6 kg)  07/14/17 208 lb 12.8 oz (94.7 kg)   Constitutional: overweight,  in NAD Eyes: PERRLA, EOMI, no exophthalmos ENT: moist mucous membranes, no thyromegaly, no cervical lymphadenopathy Cardiovascular: RRR, No MRG Respiratory: CTA B Gastrointestinal: abdomen soft, NT, ND, BS+ Musculoskeletal: no deformities, strength intact in all 4 Skin: moist, warm, no rashes Neurological: no tremor with outstretched hands, DTR normal in all 4  ASSESSMENT: 1. DM2, insulin-dependent, with complications - Peripheral neuropathy  Component     Latest Ref Rng 05/29/2015  Glutamic Acid Decarb Ab     0.0 - 5.0 U/mL <5.0   Component     Latest Ref Rng 06/25/2015  C-Peptide     0.80-3.85 ng/mL 0.87  Glucose, Fasting     65 - 99 mg/dL 161 (H)  Pancreatic Islet Cell Antibody     < 5 JDF Units <5  Vitamin B12     211 - 911 pg/mL 261  Anti-pancreatic antibodies negative. C-peptide low normal. He may have a degree of insulin deficiency, but this was not clearly evident...  Component     Latest Ref Rng & Units 02/05/2016  Hemoglobin A1C      5.3  C-Peptide     0.80 - 3.85 ng/mL 1.33  Glucose, Fasting     65 - 99 mg/dL 97   Labs again indicated good insulin production...  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 Low B12  PLAN:  1. Patient with history of insulin-dependent type 2 diabetes, with fair control at last visits on an oral medication regimen with metformin, SGLT 2 inhibitor, but also on weekly GLP-1 receptor agonist  and Tresiba.  He also started to use low doses of NovoLog for corrections of high blood sugars since last visit.  We checked him for type 1 diabetes twice and the investigation was negative. -At this visit, sugars are higher, as he is now home due to the coronavirus pandemic.  He has more dietary indiscretions and he is less active.  Also, he had respiratory infections x2 and sugars were higher than.  He has occasional hyperglycemic spikes throughout the day, especially after meals.  We decided to try to increase Jardiance to target dose of 25 mg daily.  I did advise him to stay well-hydrated on this regimen.  We will continue the rest of the regimen. - I suggested to:  Patient Instructions  Please continue:  - Metformin ER 500 mg 2x a day with meals  - Trulicity 1.5 mg weekly  - Tresiba 36 units daily  Please increase: - Jardiance to 25 mg daily  Please return in 4 months with your sugar log.     - today, HbA1c is 6.6% (higher) - continue checking sugars at different times of the day - check 2-3x a day, rotating checks - advised for yearly eye exams >> he is UTD - Return to clinic in 4 mo with sugar log    2. Peripheral neuropathy  -His symptoms improved significantly after he started alpha-lipoic acid, so he could come off Neurontin -We will continue a LAM B12  3.  low B12 -He continues on 1000 mcg daily B12 supplement.  He feels that his balance problems improved on this -Latest B12 level was only slightly high, but we continued the same supplement dose.  Carlus Pavlov, MD PhD [facility]

## 2018-07-11 NOTE — Addendum Note (Signed)
Addended by: Darliss Ridgel I on: 07/11/2018 11:01 AM   Modules accepted: Orders

## 2018-07-11 NOTE — Patient Instructions (Addendum)
Please continue:  - Metformin ER 500 mg 2x a day with meals  - Trulicity 1.5 mg weekly  - Tresiba 36 units daily  Please increase: - Jardiance to 25 mg daily  Please return in 4 months with your sugar log.

## 2018-08-26 ENCOUNTER — Other Ambulatory Visit: Payer: Self-pay | Admitting: Internal Medicine

## 2018-10-24 ENCOUNTER — Other Ambulatory Visit: Payer: Self-pay | Admitting: Internal Medicine

## 2018-11-01 ENCOUNTER — Ambulatory Visit: Payer: Self-pay | Admitting: Internal Medicine

## 2018-11-02 ENCOUNTER — Ambulatory Visit: Payer: BC Managed Care – PPO | Admitting: Internal Medicine

## 2018-11-08 ENCOUNTER — Other Ambulatory Visit: Payer: Self-pay | Admitting: Internal Medicine

## 2018-11-17 ENCOUNTER — Encounter: Payer: Self-pay | Admitting: Internal Medicine

## 2018-11-17 NOTE — Telephone Encounter (Signed)
Is this something you would feel comfortable writing or should the pt await Dr. Arman Filter return?

## 2018-12-13 ENCOUNTER — Other Ambulatory Visit: Payer: Self-pay | Admitting: Internal Medicine

## 2019-01-26 ENCOUNTER — Ambulatory Visit (INDEPENDENT_AMBULATORY_CARE_PROVIDER_SITE_OTHER): Payer: BC Managed Care – PPO | Admitting: Internal Medicine

## 2019-01-26 ENCOUNTER — Encounter: Payer: Self-pay | Admitting: Internal Medicine

## 2019-01-26 ENCOUNTER — Other Ambulatory Visit: Payer: Self-pay

## 2019-01-26 VITALS — BP 120/90 | HR 111 | Ht 73.0 in | Wt 216.0 lb

## 2019-01-26 DIAGNOSIS — Z23 Encounter for immunization: Secondary | ICD-10-CM | POA: Diagnosis not present

## 2019-01-26 DIAGNOSIS — E1142 Type 2 diabetes mellitus with diabetic polyneuropathy: Secondary | ICD-10-CM

## 2019-01-26 DIAGNOSIS — E538 Deficiency of other specified B group vitamins: Secondary | ICD-10-CM

## 2019-01-26 DIAGNOSIS — IMO0002 Reserved for concepts with insufficient information to code with codable children: Secondary | ICD-10-CM

## 2019-01-26 DIAGNOSIS — E1165 Type 2 diabetes mellitus with hyperglycemia: Secondary | ICD-10-CM | POA: Diagnosis not present

## 2019-01-26 LAB — POCT GLYCOSYLATED HEMOGLOBIN (HGB A1C): Hemoglobin A1C: 6.5 % — AB (ref 4.0–5.6)

## 2019-01-26 NOTE — Progress Notes (Signed)
Patient ID: Greg Goodman, male   DOB: 05-24-1980, 38 y.o.   MRN: [id number]  HPI: Greg Goodman is a 38 y.o.-year-old male, returning for follow-up for DM2, dx in 05/2015, insulin-dependent, uncontrolled, with complications (peripheral neuropathy). Last visit 4 months ago.  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   He has been out of [place] for few days and also of Trulicity 2/2 pbs with his insurance. Sugars were higher in last 2 weeks, but they were controlled before this period.  Reviewed history: In 05/2014 >> started to feel poorly, with dizziness, lightheadedness, and chest pressure. Retrospectively, he also had increased thirst, increased urination, increased hunger with unintentional weight loss (40 lbs in 1 year!), blurry vision.  He went to UC >> CBG: "HI" >> sent to ED: 561. He was hospitalized and then discharged on insulin.  He was in the ED 2x after the initial admission >> weak, blurry vision - 06/02/2015 (Non-ketotic Hgly - 200-300). Then, he was in the ED (High Pt Reg) 06/17/2015 >> same sxs (Non-ketotic Hgly - 200s).   Since then, he remained on a basal/bolus insulin regimen   He saw Pincus Large for carb counting teaching.  He feels comfortable with this.   We checked him for type 1 diabetes 2x and the investigation was negative.  Last hemoglobin A1c was: Lab Results  Component Value Date   HGBA1C 6.6 (A) 07/11/2018   HGBA1C 6.1 (A) 12/02/2017   HGBA1C 6.0% 07/14/2017  08/03/2016: HbA1c calculated from the fructosamine is 7.17%.  He is on:  - Metformin ER 500 mg 2x a day with meals  - Trulicity 1.5 mg weekly >> just restarted today  - Tresiba 36 units daily - Jardiance 25 mg daily  He tried regular Meformin >> bloating, AP  Pt checks his sugars 3 times a day very carefully kept log - am: 93-133, 141 >> 98-134, 150, 190-200 if forgets [place] >> 102-140, 183 - 2h after b'fast: 161,  169 >> 69 >> n/c >> 121-233 (sick) >>  118-163 - before lunch:  98-152 >> 79, 119-148 >> 92-197, 217 >> 112-219 - 2h after lunch: 110, 141 >> 153 >> 85 >> n/c >> 277 (sick) >> 134, 184, 381 - before dinner:  93-145, 130-195 >> 121-151 >> 105-157, 182 >> 126-206 - 2h after dinner:196 >> 127-171 > 114-121 >> n/c >> 120 - bedtime:  74-156, 169 >> n/c >> 117 >> 114-132 - nighttime: n/c160 Lowest sugar was 79 >> 98  >> 98; hypoglycemia awareness in the 80s Highest sugar was 277 >> 381.  Glucometer: [employer] IQ  Pt's meals are: - Breakfast: 2 eggs + English muffin or toast or cereals (6 units) - Lunch: sandwich wrap or salad (4 units) - Dinner: chicken + salad + rice (6 units)  - Snacks: rice cakes He drinks diet sodas.  -No CKD, last BUN/creatinine:  Lab Results  Component Value Date   BUN 22 12/02/2017   CREATININE 0.96 12/02/2017   -+ HL; last set of lipids: Lab Results  Component Value Date   CHOL 180 12/02/2017   HDL 44.00 12/02/2017   LDLCALC 115 (H) 12/02/2017   TRIG 103.0 12/02/2017   CHOLHDL 4 12/02/2017  Not on a statin - last eye exam was 2019: No DR -+ Slight numbness and tingling in his feet He was on Neurontin, now off. He is on alpha-lipoic acid and B12 supplement.  Symptoms are much  better on this combination.  In 04/2017, we started B12 1000 mcg daily.  Latest vitamin B12 was slightly high, but we continued the same dose. Lab Results  Component Value Date   VITAMINB12 946 (H) 12/02/2017   VITAMINB12 383 04/14/2017   VITAMINB12 261 06/25/2015   ROS: Constitutional: no weight gain/no weight loss, no fatigue, no subjective hyperthermia, no subjective hypothermia Eyes: no blurry vision, no xerophthalmia ENT: no sore throat, + nodules palpated in neck, no dysphagia, no odynophagia, no hoarseness Cardiovascular: no CP/no SOB/no palpitations/no leg swelling Respiratory: no cough/no SOB/no wheezing Gastrointestinal: no N/no V/no D/no C/no acid  reflux Musculoskeletal: no muscle aches/no joint aches Skin: no rashes, no hair loss Neurological: no tremors/no numbness/no tingling/no dizziness  I reviewed pt's medications, allergies, PMH, social hx, family hx, and changes were documented in the history of present illness. Otherwise, unchanged from my initial visit note.   Past Medical History:  Diagnosis Date  . Anxiety   . Depression   . Diabetes mellitus without complication (HCC) 05/2015   NEW ONSET   Past Surgical History:  Procedure Laterality Date  . APPENDECTOMY     Social History   Social History  . Marital Status: Single    Spouse Name: N/A  . Number of Children: 0   Occupational History  .  English teacher    Social History Main Topics  . Smoking status: Former Smoker -- 0.25 packs/day - quit 2017  . Smokeless tobacco: Never Used  . Alcohol Use: Yes     Comment: occ  . Drug Use: No  . Sexual Activity: Yes   Current Outpatient Medications on File Prior to Visit  Medication Sig Dispense Refill  . empagliflozin (JARDIANCE) 25 MG TABS tablet Take 25 mg by mouth daily. 90 tablet 3  . glucose blood (ONETOUCH VERIO) test strip Use as instructed to check sugar 4 times daily 400 each 5  . ibuprofen (ADVIL,MOTRIN) 400 MG tablet Take 400 mg by mouth every 6 (six) hours as needed for mild pain.    Marland Kitchen insulin aspart (NOVOLOG FLEXPEN) 100 UNIT/ML FlexPen Inject 0-6 Units into the skin 3 (three) times daily before meals. 15 mL 11  . Insulin Pen Needle 31G X 6 MM MISC 1 Device by Does not apply route 2 (two) times daily. 60 each 3  . metFORMIN (GLUCOPHAGE-XR) 500 MG 24 hr tablet TAKE 1 TABLET(500 MG) BY MOUTH TWICE DAILY WITH A MEAL 180 tablet 3  . OneTouch Delica Lancets 33G MISC Use 3x a day 300 each 3  . ONETOUCH VERIO test strip TEST FOUR TIMES DAILY AS DIRECTED 400 each 0  . TRESIBA FLEXTOUCH 200 UNIT/ML SOPN INJECT 36 UNITS INTO THE SKIN DAILY 9 mL 2  . TRULICITY 1.5 MG/0.5ML SOPN INJECT 1.5MG (THE CONTENTS OF 1  SYRINGE) UNDER THE SKIN ONCE EVERY WEEK 2 mL 1   No current facility-administered medications on file prior to visit.    No Known Allergies   FH: - Maternal aunt with diabetes type 2, insulin-dependent - No known family history of autoimmune diseases.  PE: BP 120/90   Pulse (!) 111   Ht  (1.854 m) Comment: measured  Wt 216 lb (98 kg)   SpO2 95%   BMI 28.50 kg/m  Body mass index is 28.5 kg/m. Wt Readings from Last 3 Encounters:  01/26/19 216 lb (98 kg)  07/11/18 208 lb (94.3 kg)  12/02/17 202 lb (91.6 kg)   Constitutional: overweight, in NAD Eyes: PERRLA, EOMI, no exophthalmos  ENT: moist mucous membranes, no thyromegaly, + L cervical enlarged peritracheal LN Cardiovascular: tachycardia, RR, No MRG Respiratory: CTA B Gastrointestinal: abdomen soft, NT, ND, BS+ Musculoskeletal: no deformities, strength intact in all 4 Skin: moist, warm, no rashes Neurological: no tremor with outstretched hands, DTR normal in all 4  ASSESSMENT: 1. DM2, insulin-dependent, with complications - Peripheral neuropathy  Component     Latest Ref Rng 05/29/2015  Glutamic Acid Decarb Ab     0.0 - 5.0 U/mL <5.0   Component     Latest Ref Rng 06/25/2015  C-Peptide     0.80-3.85 ng/mL 0.87  Glucose, Fasting     65 - 99 mg/dL 161 (H)  Pancreatic Islet Cell Antibody     < 5 JDF Units <5  Vitamin B12     211 - 911 pg/mL 261  Anti-pancreatic antibodies negative. C-peptide low normal. He may have a degree of insulin deficiency, but this was not clearly evident...  Component     Latest Ref Rng & Units 02/05/2016  Hemoglobin A1C      5.3  C-Peptide     0.80 - 3.85 ng/mL 1.33  Glucose, Fasting     65 - 99 mg/dL 97   Labs again indicated good insulin production...  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 Low B12  PLAN:  1. Patient with history of insulin-dependent type 2 diabetes, with fair control at last visit on an oral medication regimen with metformin, SGLT2 inhibitor, but also on weekly GLP-1 receptor  agonist and Tresiba.  We used NovoLog in the past for correction high blood sugars but he did not have to use this consistently.  We checked him for type 1 diabetes twice in the investigation was negative. -At last visit, sugars are higher as he was due to the coronavirus pandemic.  He had more dietary indiscretions and was less active.  Also, he had to respiratory infections.  He was having hyperglycemic spikes after meals and I advised him to increase Jardiance dose.  We kept the rest of the regimen the same. -At this visit, he tells me that sugars were controlled until approximately 2 weeks ago when he had problems with insurance and they did not allow him to refill Trulicity and [place].  This problem got recently solved after he involved his HR department.  He just got the Trulicity today and restarted it.  He is now back on his previous regimen.  Reviewing his sugars from before, they were at or close to goal so for now, I will advise him to continue the current regimen. - I suggested to:  Patient Instructions  Please continue:  - Metformin ER 500 mg 2x a day with meals  - Trulicity 1.5 mg weekly  - Tresiba 36 units daily - Jardiance 25 mg daily   Please return in 4 months with your sugar log.     - we checked his HbA1c: 6.5% (sigtly lower) - advised to check sugars at different times of the day - 2x a day, rotating check times - advised for yearly eye exams >> he is UTD - will check annual labs today - return to clinic in 4 months    2. Peripheral neuropathy  -His symptoms improved significantly after he started alpha-lipoic acid so he can come off Neurontin -We will continue alpha-lipoic acid and B12  3.  low B12 -He continues on 1000 mcg B12 supplement.  He feels that his balance problems improved on this. -Latest B12 level was only  slightly high so we continue the same supplement dose -We will recheck today  Component     Latest Ref Rng & Units 01/26/2019          Glucose      65 - 99 mg/dL 409123 (H)  BUN     7 - 25 mg/dL 15  Creatinine     8.110.60 - 1.35 mg/dL 9.140.98  GFR, Est Non African American     > OR = 60 mL/min/1.7773m2 99  GFR, Est African American     > OR = 60 mL/min/1.3273m2 114  BUN/Creatinine Ratio     6 - 22 (calc) NOT APPLICABLE  Sodium     135 - 146 mmol/L 144  Potassium     3.5 - 5.3 mmol/L 4.2  Chloride     98 - 110 mmol/L 105  CO2     20 - 32 mmol/L 26  Calcium     8.6 - 10.3 mg/dL 9.7  Total Protein     6.1 - 8.1 g/dL 7.2  Albumin MSPROF     3.6 - 5.1 g/dL 4.9  Globulin     1.9 - 3.7 g/dL (calc) 2.3  AG Ratio     1.0 - 2.5 (calc) 2.1  Total Bilirubin     0.2 - 1.2 mg/dL 0.4  Alkaline phosphatase (APISO)     36 - 130 U/L 69  AST     10 - 40 U/L 33  ALT     9 - 46 U/L 88 (H)  Cholesterol     <200 mg/dL 782175  HDL Cholesterol     > OR = 40 mg/dL 35 (L)  Triglycerides     <150 mg/dL 956108  LDL Cholesterol (Calc)     mg/dL (calc) 213118 (H)  Total CHOL/HDL Ratio     <5.0 (calc) 5.0 (H)  Non-HDL Cholesterol (Calc)     <130 mg/dL (calc) 086140 (H)  Creatinine, Urine     20 - 320 mg/dL 91  Microalb, Ur     mg/dL 0.6  MICROALB/CREAT RATIO     <30 mcg/mg creat 7  TSH     0.40 - 4.50 mIU/L 1.55  Vitamin B12     200 - 1,100 pg/mL 616   TSH, B12, ACR, kidney test normal.  LDL above target of 100, HDL slightly low.  Glucose slightly high.  ALT high.  We will advise him to check with PCP if further investigation is needed for this.  May need a statin in the future, but for now, will need to improve diet.  Carlus Pavlovristina Iman Reinertsen, MD PhD [facility]

## 2019-01-26 NOTE — Patient Instructions (Signed)
Please continue:  - Metformin ER 500 mg 2x a day with meals  - Trulicity 1.5 mg weekly  - Tresiba 36 units daily - Jardiance 25 mg daily   Please return in 4 months with your sugar log.

## 2019-01-27 LAB — COMPLETE METABOLIC PANEL WITH GFR
AG Ratio: 2.1 (calc) (ref 1.0–2.5)
ALT: 88 U/L — ABNORMAL HIGH (ref 9–46)
AST: 33 U/L (ref 10–40)
Albumin: 4.9 g/dL (ref 3.6–5.1)
Alkaline phosphatase (APISO): 69 U/L (ref 36–130)
BUN: 15 mg/dL (ref 7–25)
CO2: 26 mmol/L (ref 20–32)
Calcium: 9.7 mg/dL (ref 8.6–10.3)
Chloride: 105 mmol/L (ref 98–110)
Creat: 0.98 mg/dL (ref 0.60–1.35)
GFR, Est African American: 114 mL/min/{1.73_m2} (ref >=60)
GFR, Est Non African American: 99 mL/min/{1.73_m2} (ref >=60)
Globulin: 2.3 g/dL (calc) (ref 1.9–3.7)
Glucose, Bld: 123 mg/dL — ABNORMAL HIGH (ref 65–99)
Potassium: 4.2 mmol/L (ref 3.5–5.3)
Sodium: 144 mmol/L (ref 135–146)
Total Bilirubin: 0.4 mg/dL (ref 0.2–1.2)
Total Protein: 7.2 g/dL (ref 6.1–8.1)

## 2019-01-27 LAB — LIPID PANEL
Cholesterol: 175 mg/dL (ref <200)
HDL: 35 mg/dL — ABNORMAL LOW (ref >=40)
LDL Cholesterol (Calc): 118 mg/dL (calc) — ABNORMAL HIGH
Non-HDL Cholesterol (Calc): 140 mg/dL (calc) — ABNORMAL HIGH (ref <130)
Total CHOL/HDL Ratio: 5 (calc) — ABNORMAL HIGH (ref <5.0)
Triglycerides: 108 mg/dL (ref <150)

## 2019-01-27 LAB — VITAMIN B12: Vitamin B-12: 616 pg/mL (ref 200–1100)

## 2019-01-27 LAB — MICROALBUMIN / CREATININE URINE RATIO
Creatinine, Urine: 91 mg/dL (ref 20–320)
Microalb Creat Ratio: 7 mcg/mg creat (ref <30)
Microalb, Ur: 0.6 mg/dL

## 2019-01-27 LAB — TSH: TSH: 1.55 mIU/L (ref 0.40–4.50)

## 2019-02-20 ENCOUNTER — Other Ambulatory Visit: Payer: Self-pay | Admitting: Internal Medicine

## 2019-04-04 ENCOUNTER — Other Ambulatory Visit: Payer: Self-pay

## 2019-04-04 MED ORDER — METFORMIN HCL ER 500 MG PO TB24: ORAL_TABLET | ORAL | 3 refills | Status: DC

## 2019-04-14 ENCOUNTER — Other Ambulatory Visit: Payer: Self-pay | Admitting: Internal Medicine

## 2019-04-26 ENCOUNTER — Other Ambulatory Visit: Payer: Self-pay | Admitting: Internal Medicine

## 2019-05-29 ENCOUNTER — Other Ambulatory Visit: Payer: Self-pay | Admitting: Internal Medicine

## 2019-05-30 ENCOUNTER — Encounter: Payer: Self-pay | Admitting: Internal Medicine

## 2019-05-30 ENCOUNTER — Other Ambulatory Visit: Payer: Self-pay

## 2019-05-30 ENCOUNTER — Ambulatory Visit: Payer: BC Managed Care – PPO | Admitting: Internal Medicine

## 2019-05-30 VITALS — BP 130/88 | HR 87 | Ht 73.0 in | Wt 217.0 lb

## 2019-05-30 DIAGNOSIS — E538 Deficiency of other specified B group vitamins: Secondary | ICD-10-CM

## 2019-05-30 DIAGNOSIS — E1142 Type 2 diabetes mellitus with diabetic polyneuropathy: Secondary | ICD-10-CM | POA: Diagnosis not present

## 2019-05-30 DIAGNOSIS — IMO0002 Reserved for concepts with insufficient information to code with codable children: Secondary | ICD-10-CM

## 2019-05-30 DIAGNOSIS — E1165 Type 2 diabetes mellitus with hyperglycemia: Secondary | ICD-10-CM

## 2019-05-30 LAB — POCT GLYCOSYLATED HEMOGLOBIN (HGB A1C): Hemoglobin A1C: 6.3 % — AB (ref 4.0–5.6)

## 2019-05-30 NOTE — Patient Instructions (Signed)
Please continue:  - Metformin ER 500 mg 2x a day with meals  - Jardiance 25 mg daily   - Trulicity 1.5 mg weekly  - Tresiba 36 units daily  Please return in 4 months with your sugar log.

## 2019-05-30 NOTE — Progress Notes (Signed)
Patient ID: Greg Goodman, male   DOB: 10-12-1980, 39 y.o.   MRN: [id number]  This visit occurred during the SARS-CoV-2 public health emergency.  Safety protocols were in place, including screening questions prior to the visit, additional usage of staff PPE, and extensive cleaning of exam room while observing appropriate contact time as indicated for disinfecting solutions.   HPI: Greg Goodman is a 39 y.o.-year-old male, returning for follow-up for DM2, dx in 05/2015, insulin-dependent, uncontrolled, with complications (peripheral neuropathy). Last visit 4 months ago.  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   He started Lexapro by PCP recently.  He is still getting used to the medication but felt that his anxiety is better.  Sugars are also slightly better.  Reviewed history: In 05/2014 >> started to feel poorly, with dizziness, lightheadedness, and chest pressure. Retrospectively, he also had increased thirst, increased urination, increased hunger with unintentional weight loss (40 lbs in 1 year!), blurry vision.  He went to UC >> CBG: "HI" >> sent to ED: 561. He was hospitalized and then discharged on insulin.  He was in the ED 2x after the initial admission >> weak, blurry vision - 06/02/2015 (Non-ketotic Hgly - 200-300). Then, he was in the ED (High Pt Reg) 06/17/2015 >> same sxs (Non-ketotic Hgly - 200s).   Since then, he remained on a basal/bolus insulin regimen   He saw Pincus Large for carb counting teaching.  He feels comfortable with this.   We checked him for type 1 diabetes 2x and the investigation was negative.  Last hemoglobin A1c was: Lab Results  Component Value Date   HGBA1C 6.5 (A) 01/26/2019   HGBA1C 6.6 (A) 07/11/2018   HGBA1C 6.1 (A) 12/02/2017  08/03/2016: HbA1c calculated from the fructosamine is 7.17%.  He is on:  - Metformin ER  500 mg 2x a day with meals  -  Jardiance 25 mg daily   - Trulicity 1.5 weekly   - Tresiba 36 units daily He tried regular Meformin >> bloating, AP  Pt checks his sugars 3 times a day per his carefully kept log: - am: 98-134, 150, 190-200 if forgets Tresiba >> 102-140, 183 >> 92-136 - 2h after b'fast: 69 >> n/c >> 121-233 (sick) >>  118-163 >> 122 - before lunch:  79, 119-148 >> 92-197, 217 >> 112-219 >> 65-140, 152 - 2h after lunch: 85 >> n/c >> 277 (sick) >> 134, 184, 381 >> 205 - before dinner:   121-151 >> 105-157, 182 >> 126-206 >> 120-164, 172 - 2h after dinner:196 >> 127-171 > 114-121 >> n/c >> 120 >> n/c - bedtime:  74-156, 169 >> n/c >> 117 >> 114-132 >> n/c - nighttime: n/c160 Lowest sugar was 79 >> 98  >> 98 >> 92; hypoglycemia awareness in the 80s Highest sugar was 277 >> 381 >> 205.  Glucometer: [employer] IQ  Pt's meals are: - Breakfast: 2 eggs + English muffin or toast or cereals (6 units) - Lunch: sandwich wrap or salad (4 units) - Dinner: chicken + salad + rice (6 units)  - Snacks: rice cakes He drinks diet sodas.  No CKD, last BUN/creatinine:  05/08/2019: 13/0.8, GFR 109, glucose 141 Lab Results  Component Value Date   BUN 15 01/26/2019   CREATININE 0.98 01/26/2019   + HL; last set of lipids: 05/08/2019: 190/111/40/130 Lab Results  Component Value Date   CHOL 175 01/26/2019   HDL  35 (L) 01/26/2019   LDLCALC 118 (H) 01/26/2019   TRIG 108 01/26/2019   CHOLHDL 5.0 (H) 01/26/2019  He is not on a statin, however, PCP is planning to start one per review of his notes. - last eye exam was 2019: No DR. Scheduled on 08/09/2019. -+ Slight numbness and tingling in his feet He was on Neurontin, now off. He is on alpha-lipoic acid and B12 supplement.  Symptoms are much better on this combination  In 04/2017, we started B12 1000 mcg daily.  Latest vitamin B12 was slightly high, but we continued the same dose. 05/08/2019: Vitamin B12 415 Lab Results  Component Value Date   VITAMINB12 616  01/26/2019   VITAMINB12 946 (H) 12/02/2017   VITAMINB12 383 04/14/2017   VITAMINB12 261 06/25/2015   Latest TSH: 05/08/2019: TSH 2.21  ROS: Constitutional: no weight gain/no weight loss, no fatigue, no subjective hyperthermia, no subjective hypothermia Eyes: no blurry vision, no xerophthalmia ENT: no sore throat, no nodules palpated in neck, no dysphagia, no odynophagia, no hoarseness Cardiovascular: no CP/no SOB/no palpitations/no leg swelling Respiratory: no cough/no SOB/no wheezing Gastrointestinal: no N/no V/no D/no C/no acid reflux Musculoskeletal: no muscle aches/no joint aches Skin: no rashes, no hair loss Neurological: no tremors/no numbness/no tingling/no dizziness  I reviewed pt's medications, allergies, PMH, social hx, family hx, and changes were documented in the history of present illness. Otherwise, unchanged from my initial visit note.  Past Medical History:  Diagnosis Date  . Anxiety   . Depression   . Diabetes mellitus without complication (HCC) 05/2015   NEW ONSET   Past Surgical History:  Procedure Laterality Date  . APPENDECTOMY     Social History   Social History  . Marital Status: Single    Spouse Name: N/A  . Number of Children: 0   Occupational History  .  English teacher    Social History Main Topics  . Smoking status: Former Smoker -- 0.25 packs/day - quit 2017  . Smokeless tobacco: Never Used  . Alcohol Use: Yes     Comment: occ  . Drug Use: No  . Sexual Activity: Yes   Current Outpatient Medications on File Prior to Visit  Medication Sig Dispense Refill  . empagliflozin (JARDIANCE) 25 MG TABS tablet Take 25 mg by mouth daily. 90 tablet 3  . insulin aspart (NOVOLOG FLEXPEN) 100 UNIT/ML FlexPen Inject 0-6 Units into the skin 3 (three) times daily before meals. 15 mL 11  . Insulin Pen Needle 31G X 6 MM MISC 1 Device by Does not apply route 2 (two) times daily. 60 each 3  . metFORMIN (GLUCOPHAGE-XR) 500 MG 24 hr tablet TAKE 1 TABLET(500  MG) BY MOUTH TWICE DAILY WITH A MEAL 180 tablet 3  . OneTouch Delica Lancets 33G MISC Use 3x a day 300 each 3  . ONETOUCH VERIO test strip TEST FOUR TIMES DAILY AS DIRECTED 400 each 0  . ONETOUCH VERIO test strip USE AS INSTRUCTED TO CHECK SUGAR FOUR TIMES DAILY 400 strip 11  . TRESIBA FLEXTOUCH 200 UNIT/ML SOPN INJECT 36 UNITS INTO THE SKIN DAILY 9 mL 2  . TRULICITY 1.5 MG/0.5ML SOPN INJECT 1.5MG (THE CONTENTS OF 1 SYRINGE) UNDER THE SKIN ONCE EVERY WEEK 2 mL 2   No current facility-administered medications on file prior to visit.   No Known Allergies   FH: - Maternal aunt with diabetes type 2, insulin-dependent - No known family history of autoimmune diseases.  PE: There were no vitals taken for this visit. There  is no height or weight on file to calculate BMI. Wt Readings from Last 3 Encounters:  01/26/19 216 lb (98 kg)  07/11/18 208 lb (94.3 kg)  12/02/17 202 lb (91.6 kg)   Constitutional: overweight, in NAD Eyes: PERRLA, EOMI, no exophthalmos ENT: moist mucous membranes, no thyromegaly, no cervical lymphadenopathy Cardiovascular: RRR, No MRG Respiratory: CTA B Gastrointestinal: abdomen soft, NT, ND, BS+ Musculoskeletal: no deformities, strength intact in all 4 Skin: moist, warm, no rashes Neurological: no tremor with outstretched hands, DTR normal in all 4  ASSESSMENT: 1. DM2, insulin-dependent, with complications - Peripheral neuropathy  Component     Latest Ref Rng 05/29/2015  Glutamic Acid Decarb Ab     0.0 - 5.0 U/mL <5.0   Component     Latest Ref Rng 06/25/2015  C-Peptide     0.80-3.85 ng/mL 0.87  Glucose, Fasting     65 - 99 mg/dL 161 (H)  Pancreatic Islet Cell Antibody     < 5 JDF Units <5  Vitamin B12     211 - 911 pg/mL 261  Anti-pancreatic antibodies negative. C-peptide low normal. He may have a degree of insulin deficiency, but this was not clearly evident...  Component     Latest Ref Rng & Units 02/05/2016  Hemoglobin A1C      5.3  C-Peptide      0.80 - 3.85 ng/mL 1.33  Glucose, Fasting     65 - 99 mg/dL 97   Labs again indicated good insulin production...  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 Low B12  PLAN:  1. Patient with history of insulin-dependent type 2 diabetes with fair control on oral antidiabetic regimen with Metformin and SGLT 2 inhibitor, but also daily basal insulin and weekly GLP-1 receptor agonist.  We used NovoLog in the past for correction of high blood sugars but he did not have to use this consistently.  We checked him for type 1 diabetes twice and the investigation was negative.  At last visit, sugars were controlled until 2 weeks prior to the appointment when he had problems with insurance and would not go to [place].  The problem resolved after he involved his HR department.  His sugars started to improve after he resumed his previous injectable medications.  We did not change the regimen. -At this visit, sugars have improved and he occasionally mild hyperglycemia especially if he has a snack in the afternoon.  However, overall, sugars are better and this may be related to his decreased anxiety while on Lexapro.  We do not need to change his regimen for now. - I suggested to:  Patient Instructions  Please continue:  - Metformin ER 500 mg 2x a day with meals  - Jardiance 25 mg daily   - Trulicity 1.5 mg weekly  - Tresiba 36 units daily  Please return in 4 months with your sugar log.     - we checked his HbA1c: 6.3% (improved) - advised to check sugars at different times of the day - 2-3x a day, rotating check times - advised for yearly eye exams >> he is not UTD, but this is scheduled - return to clinic in 4 months   2. Peripheral neuropathy  -His symptoms improved significantly after he started on fibrillate casted so he could come off to be around -Continues alpha-lipoic acid and B12  3.  low B12 -Feels that his balance problem improved after starting supplementation with B12 -Continues on 1000 mcg B12  supplement -B12 was  normal at last visit and this was again checked by PCP last month and it was normal   Carlus Pavlov, MD PhD [facility] Endocrinology

## 2019-06-09 ENCOUNTER — Ambulatory Visit: Payer: BC Managed Care – PPO | Attending: Internal Medicine

## 2019-06-09 DIAGNOSIS — Z23 Encounter for immunization: Secondary | ICD-10-CM

## 2019-06-09 NOTE — Progress Notes (Signed)
   Covid-19 Vaccination Clinic  Name:  Greg Goodman    MRN: [id number] DOB: 11-15-1980  06/09/2019  [name] was observed post Covid-19 immunization for 15 minutes without incidence. He was provided with Vaccine Information Sheet and instruction to access the V-Safe system.   [name] was instructed to call 911 with any severe reactions post vaccine: Marland Kitchen Difficulty breathing  . Swelling of your face and throat  . A fast heartbeat  . A bad rash all over your body  . Dizziness and weakness    Immunizations Administered    Name Date Dose VIS Date Route   Pfizer COVID-19 Vaccine 06/09/2019  2:59 PM 0.3 mL 03/23/2019 Intramuscular   Manufacturer: [employer], [employer]   Lot: [id number]   NDC: [address]-9

## 2019-06-30 ENCOUNTER — Ambulatory Visit: Payer: BC Managed Care – PPO | Attending: Internal Medicine

## 2019-06-30 DIAGNOSIS — Z23 Encounter for immunization: Secondary | ICD-10-CM

## 2019-06-30 NOTE — Progress Notes (Signed)
   Covid-19 Vaccination Clinic  Name:  Kiowa Hollar    MRN: [id number] DOB: [birth year]  06/30/2019  Mr. Rajewski was observed post Covid-19 immunization for 15 minutes without incident. He was provided with Vaccine Information Sheet and instruction to access the V-Safe system.   Mr. Hoel was instructed to call 911 with any severe reactions post vaccine: Marland Kitchen Difficulty breathing  . Swelling of face and throat  . A fast heartbeat  . A bad rash all over body  . Dizziness and weakness   Immunizations Administered    Name Date Dose VIS Date Route   Pfizer COVID-19 Vaccine 06/30/2019  8:36 AM 0.3 mL 03/23/2019 Intramuscular   Manufacturer: [employer], [employer]   Lot: [id number]   NDC: [address]-8

## 2019-09-01 ENCOUNTER — Other Ambulatory Visit: Payer: Self-pay | Admitting: Internal Medicine

## 2019-09-03 ENCOUNTER — Other Ambulatory Visit: Payer: Self-pay | Admitting: Internal Medicine

## 2019-09-12 ENCOUNTER — Other Ambulatory Visit: Payer: Self-pay | Admitting: Internal Medicine

## 2019-10-03 ENCOUNTER — Ambulatory Visit (INDEPENDENT_AMBULATORY_CARE_PROVIDER_SITE_OTHER): Payer: BC Managed Care – PPO | Admitting: Internal Medicine

## 2019-10-03 DIAGNOSIS — Z5329 Procedure and treatment not carried out because of patient's decision for other reasons: Secondary | ICD-10-CM

## 2019-10-03 NOTE — Patient Instructions (Signed)
Please continue:  - Metformin ER 500 mg 2x a day with meals  - Jardiance 25 mg daily   - Trulicity 1.5 mg weekly  - Tresiba 36 units daily  Please return in 4 months with your sugar log.

## 2019-10-03 NOTE — Progress Notes (Signed)
Patient ID: Greg Goodman, male   DOB: 15-Sep-1980, 39 y.o.   MRN: [id number]  CANCELLED SAME DAY  HPI: Greg Goodman is a 39 y.o.-year-old male, returning for follow-up for DM2, dx in 05/2015, insulin-dependent, uncontrolled, with complications (peripheral neuropathy). Last visit 4 months ago.  At last visit, he was just started on Lexapro by PCP.  His anxiety was better sugars also improved.  Reviewed history: In 05/2014 >> started to feel poorly, with dizziness, lightheadedness, and chest pressure. Retrospectively, he also had increased thirst, increased urination, increased hunger with unintentional weight loss (40 lbs in 1 year!), blurry vision.  He went to UC >> CBG: "HI" >> sent to ED: 561. He was hospitalized and then discharged on insulin.  He was in the ED 2x after the initial admission >> weak, blurry vision - 06/02/2015 (Non-ketotic Hgly - 200-300). Then, he was in the ED (High Pt Reg) 06/17/2015 >> same sxs (Non-ketotic Hgly - 200s).   Since then, he remained on a basal/bolus insulin regimen   He saw Jeanie Sewer for carb counting teaching.  He feels comfortable with this.   We checked him for type 1 diabetes 2x and the investigation was negative.  Reviewed HbA1c levels: Lab Results  Component Value Date   HGBA1C 6.3 (A) 05/30/2019   HGBA1C 6.5 (A) 01/26/2019   HGBA1C 6.6 (A) 07/11/2018  08/03/2016: HbA1c calculated from the fructosamine is 7.17%.  He is on:  - Metformin ER  500 mg 2x a day with meals  - Jardiance 25 mg daily   - Trulicity 1.5 weekly   - Tresiba 36 units daily He tried regular Meformin >> bloating, AP  Pt checks his sugars 3 times a day per his carefully kept log: - am: 98-134, 150, 190-200 if forgets Tresiba >> 102-140, 183 >> 92-136 - 2h after b'fast: 69 >> n/c >> 121-233 (sick) >>  118-163 >> 122 - before lunch:  79, 119-148 >> 92-197, 217 >> 112-219 >> 65-140, 152 - 2h after lunch: 85 >> n/c >> 277 (sick) >> 134, 184, 381 >> 205 - before  dinner:   121-151 >> 105-157, 182 >> 126-206 >> 120-164, 172 - 2h after dinner:196 >> 127-171 > 114-121 >> n/c >> 120 >> n/c - bedtime:  74-156, 169 >> n/c >> 117 >> 114-132 >> n/c - nighttime: n/c160 Lowest sugar was 79 >> 98  >> 98 >> 92; hypoglycemia awareness in the 80s Highest sugar was 277 >> 381 >> 205.  Glucometer: [employer] IQ  Pt's meals are: - Breakfast: 2 eggs + English muffin or toast or cereals (6 units) - Lunch: sandwich wrap or salad (4 units) - Dinner: chicken + salad + rice (6 units)  - Snacks: rice cakes He drinks diet sodas.  No CKD, last BUN/creatinine:  05/08/2019: 13/0.8, GFR 109, glucose 141 Lab Results  Component Value Date   BUN 15 01/26/2019   CREATININE 0.98 01/26/2019   + HL: Last set of lipids: 05/08/2019: 190/111/40/130 Lab Results  Component Value Date   CHOL 175 01/26/2019   HDL 35 (L) 01/26/2019   LDLCALC 118 (H) 01/26/2019   TRIG 108 01/26/2019   CHOLHDL 5.0 (H) 01/26/2019  Not on a statin. - last eye exam was 07/2019: No DR -+ Slight numbness and tingling in his feet Previously on Neurontin, now off. He is now on alpha-lipoic acid and B12 vitamin.  Symptoms improved significantly on this combination.  In 04/2017, we started B12 1000 mcg daily.  Latest B12 level:  05/08/2019: Vitamin B12 415 Lab Results  Component Value Date   VITAMINB12 616 01/26/2019   VITAMINB12 946 (H) 12/02/2017   VITAMINB12 383 04/14/2017   VITAMINB12 261 06/25/2015   Latest TSH: 05/08/2019: TSH 2.21 Lab Results  Component Value Date   TSH 1.55 01/26/2019   He works as an Tax inspector.  ROS: Constitutional: no weight gain/no weight loss, no fatigue, no subjective hyperthermia, no subjective hypothermia Eyes: no blurry vision, no xerophthalmia ENT: no sore throat, no nodules palpated in neck, no dysphagia, no odynophagia, no hoarseness Cardiovascular: no CP/no SOB/no palpitations/no leg swelling Respiratory: no cough/no SOB/no  wheezing Gastrointestinal: no N/no V/no D/no C/no acid reflux Musculoskeletal: no muscle aches/no joint aches Skin: no rashes, no hair loss Neurological: no tremors/+ numbness/+ tingling/no dizziness  I reviewed pt's medications, allergies, PMH, social hx, family hx, and changes were documented in the history of present illness. Otherwise, unchanged from my initial visit note.  Past Medical History:  Diagnosis Date  . Anxiety   . Depression   . Diabetes mellitus without complication (HCC) 05/2015   NEW ONSET   Past Surgical History:  Procedure Laterality Date  . APPENDECTOMY     Social History   Social History  . Marital Status: Single    Spouse Name: N/A  . Number of Children: 0   Occupational History  .  English teacher    Social History Main Topics  . Smoking status: Former Smoker -- 0.25 packs/day - quit 2017  . Smokeless tobacco: Never Used  . Alcohol Use: Yes     Comment: occ  . Drug Use: No  . Sexual Activity: Yes   Current Outpatient Medications on File Prior to Visit  Medication Sig Dispense Refill  . insulin aspart (NOVOLOG FLEXPEN) 100 UNIT/ML FlexPen Inject 0-6 Units into the skin 3 (three) times daily before meals. 15 mL 11  . Insulin Pen Needle 31G X 6 MM MISC 1 Device by Does not apply route 2 (two) times daily. 60 each 3  . JARDIANCE 25 MG TABS tablet TAKE 1 TABLET BY MOUTH DAILY 90 tablet 0  . metFORMIN (GLUCOPHAGE-XR) 500 MG 24 hr tablet TAKE 1 TABLET(500 MG) BY MOUTH TWICE DAILY WITH A MEAL 180 tablet 3  . OneTouch Delica Lancets 33G MISC Use 3x a day 300 each 3  . ONETOUCH VERIO test strip TEST FOUR TIMES DAILY AS DIRECTED 400 each 0  . ONETOUCH VERIO test strip USE AS INSTRUCTED TO CHECK SUGAR FOUR TIMES DAILY 400 strip 11  . TRESIBA FLEXTOUCH 200 UNIT/ML FlexTouch Pen INJECT 36 UNITS INTO THE SKIN DAILY 9 mL 2  . TRULICITY 1.5 MG/0.5ML SOPN INJECT 1.5MG (THE CONTENTS OF 1 SYRINGE) UNDER THE SKIN ONCE EVERY WEEK 2 mL 2   No current  facility-administered medications on file prior to visit.   No Known Allergies   FH: - Maternal aunt with diabetes type 2, insulin-dependent - No known family history of autoimmune diseases.  PE: There were no vitals taken for this visit. There is no height or weight on file to calculate BMI. Wt Readings from Last 3 Encounters:  05/30/19 217 lb (98.4 kg)  01/26/19 216 lb (98 kg)  07/11/18 208 lb (94.3 kg)   Constitutional: overweight, in NAD Eyes: PERRLA, EOMI, no exophthalmos ENT: moist mucous membranes, no thyromegaly, no cervical lymphadenopathy Cardiovascular: RRR, No MRG Respiratory: CTA B Gastrointestinal: abdomen soft, NT, ND, BS+ Musculoskeletal: no deformities, strength intact in all 4 Skin: moist, warm, no rashes Neurological: no tremor  with outstretched hands, DTR normal in all 4  ASSESSMENT: 1. DM2, insulin-dependent, with complications - Peripheral neuropathy  Component     Latest Ref Rng 05/29/2015  Glutamic Acid Decarb Ab     0.0 - 5.0 U/mL <5.0   Component     Latest Ref Rng 06/25/2015  C-Peptide     0.80-3.85 ng/mL 0.87  Glucose, Fasting     65 - 99 mg/dL 401 (H)  Pancreatic Islet Cell Antibody     < 5 JDF Units <5  Vitamin B12     211 - 911 pg/mL 261  Anti-pancreatic antibodies negative. C-peptide low normal. He may have a degree of insulin deficiency, but this was not clearly evident...  Component     Latest Ref Rng & Units 02/05/2016  Hemoglobin A1C      5.3  C-Peptide     0.80 - 3.85 ng/mL 1.33  Glucose, Fasting     65 - 99 mg/dL 97   Labs again indicated good insulin production...  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 Low B12  PLAN:  1. Patient with history of insulin-dependent type 2 diabetes with fair control on oral antidiabetic regimen with Metformin, SGLT2 inhibitor and also basal insulin and weekly GLP-1 receptor agonist.  We use NovoLog in the past for correction of high blood sugars but he did not have to use this consistently, so we stopped.  We  checked him for type 1 diabetes twice and the investigation was negative.  At last visit, sugars were better with only occasional mild hypoglycemia especially if he had a snack in the afternoon, but overall, with improved control, possibly related to improved anxiety after starting Lexapro.  We did not change his regimen at that time.  - I suggested to:  Patient Instructions  Please continue:  - Metformin ER 500 mg 2x a day with meals  - Jardiance 25 mg daily   - Trulicity 1.5 mg weekly  - Tresiba 36 units daily  Please return in 4 months with your sugar log.     - we checked his HbA1c: 7%  - advised to check sugars at different times of the day - 1x a day, rotating check times - advised for yearly eye exams >> he is UTD - return to clinic in 4 months   2. Peripheral neuropathy  -His symptoms improved significantly after he started on alpha-lipoic acid and B12 -Continues on the above supplements  3.  Low B12 vitamin -His balance problem improved after starting supplementation with B12 -Continues on 1000 mcg B12 daily -Latest B12 levels were normal   Carlus Pavlov, MD PhD [facility] Endocrinology

## 2019-10-06 ENCOUNTER — Other Ambulatory Visit: Payer: Self-pay | Admitting: Internal Medicine

## 2019-10-22 ENCOUNTER — Encounter: Payer: Self-pay | Admitting: Internal Medicine

## 2019-10-22 ENCOUNTER — Ambulatory Visit (INDEPENDENT_AMBULATORY_CARE_PROVIDER_SITE_OTHER): Payer: BC Managed Care – PPO | Admitting: Internal Medicine

## 2019-10-22 DIAGNOSIS — Z5329 Procedure and treatment not carried out because of patient's decision for other reasons: Secondary | ICD-10-CM

## 2019-10-22 NOTE — Progress Notes (Signed)
Patient ID: Greg Goodman, male   DOB: 1980-05-17, 39 y.o.   MRN: [id number]  NO SHOW x2  HPI: Greg Goodman is a 39 y.o.-year-old male, returning for follow-up for DM2, dx in 05/2015, insulin-dependent, uncontrolled, with complications (peripheral neuropathy). Last visit 5 months ago.  At last visit, he was just started on Lexapro by PCP.  Reviewed history: In 05/2014 >> started to feel poorly, with dizziness, lightheadedness, and chest pressure. Retrospectively, he also had increased thirst, increased urination, increased hunger with unintentional weight loss (40 lbs in 1 year!), blurry vision.  He went to UC >> CBG: "HI" >> sent to ED: 561. He was hospitalized and then discharged on insulin.  He was in the ED 2x after the initial admission >> weak, blurry vision - 06/02/2015 (Non-ketotic Hgly - 200-300). Then, he was in the ED (High Pt Reg) 06/17/2015 >> same sxs (Non-ketotic Hgly - 200s).   Since then, he remained on a basal/bolus insulin regimen   He saw Greg Goodman for carb counting teaching.  He feels comfortable with this.   We checked him for type 1 diabetes 2x and the investigation was negative.  Reviewed HbA1c levels: Lab Results  Component Value Date   HGBA1C 6.3 (A) 05/30/2019   HGBA1C 6.5 (A) 01/26/2019   HGBA1C 6.6 (A) 07/11/2018  08/03/2016: HbA1c calculated from the fructosamine is 7.17%.  He is on:  - Metformin ER 500 mg 2x a day with meals  - Jardiance 25 mg daily   - Trulicity 1.5 weekly   - Tresiba 36 units daily He tried regular Meformin >> bloating, AP  Pt checks his sugars 3 times a day per his carefully kept log: - am: 98-134, 150, 190-200 if forgets Tresiba >> 102-140, 183 >> 92-136 - 2h after b'fast: 69 >> n/c >> 121-233 (sick) >>  118-163 >> 122 - before lunch:  79, 119-148 >> 92-197, 217 >> 112-219 >> 65-140, 152 - 2h after lunch: 85 >> n/c >> 277 (sick) >> 134, 184, 381 >> 205 - before dinner:   121-151 >> 105-157, 182 >> 126-206 >> 120-164,  172 - 2h after dinner:196 >> 127-171 > 114-121 >> n/c >> 120 >> n/c - bedtime:  74-156, 169 >> n/c >> 117 >> 114-132 >> n/c - nighttime: n/c160 Lowest sugar was 79 >> 98  >> 98 >> 92; hypoglycemia awareness in the 80s. Highest sugar was 277 >> 381 >> 205.  Glucometer: [employer] IQ  Pt's meals are: - Breakfast: 2 eggs + English muffin or toast or cereals (6 units) - Lunch: sandwich wrap or salad (4 units) - Dinner: chicken + salad + rice (6 units)  - Snacks: rice cakes He drinks diet sodas.  No CKD, last BUN/creatinine:  05/08/2019: 13/0.8, GFR 109, glucose 141 Lab Results  Component Value Date   BUN 15 01/26/2019   CREATININE 0.98 01/26/2019   + HL: Last set of lipids: 05/08/2019: 190/111/40/130 Lab Results  Component Value Date   CHOL 175 01/26/2019   HDL 35 (L) 01/26/2019   LDLCALC 118 (H) 01/26/2019   TRIG 108 01/26/2019   CHOLHDL 5.0 (H) 01/26/2019  Not on a statin. - last eye exam was 07/2019: No DR -+ Slight numbness and tingling in his feet Previously on Neurontin, then off. On alpha-lipoic acid and B12 vitamin.  Symptoms improved significantly on this combination.  In 04/2017, we started B12 1000 mcg daily.    Review latest B12 level: 05/08/2019: Vitamin B12 415 Lab Results  Component Value  Date   VITAMINB12 616 01/26/2019   VITAMINB12 946 (H) 12/02/2017   VITAMINB12 383 04/14/2017   VITAMINB12 261 06/25/2015   Review latest TSH: 05/08/2019: TSH 2.21 Lab Results  Component Value Date   TSH 1.55 01/26/2019   He works as an Tax inspector.  ROS: Constitutional: no weight gain/no weight loss, no fatigue, no subjective hyperthermia, no subjective hypothermia Eyes: no blurry vision, no xerophthalmia ENT: no sore throat, no nodules palpated in neck, no dysphagia, no odynophagia, no hoarseness Cardiovascular: no CP/no SOB/no palpitations/no leg swelling Respiratory: no cough/no SOB/no wheezing Gastrointestinal: no N/no V/no D/no C/no acid  reflux Musculoskeletal: no muscle aches/no joint aches Skin: no rashes, no hair loss Neurological: no tremors/+ numbness/+ tingling/no dizziness  I reviewed pt's medications, allergies, PMH, social hx, family hx, and changes were documented in the history of present illness. Otherwise, unchanged from my initial visit note.  Past Medical History:  Diagnosis Date  . Anxiety   . Depression   . Diabetes mellitus without complication (HCC) 05/2015   NEW ONSET   Past Surgical History:  Procedure Laterality Date  . APPENDECTOMY     Social History   Social History  . Marital Status: Single    Spouse Name: N/A  . Number of Children: 0   Occupational History  .  English teacher    Social History Main Topics  . Smoking status: Former Smoker -- 0.25 packs/day - quit 2017  . Smokeless tobacco: Never Used  . Alcohol Use: Yes     Comment: occ  . Drug Use: No  . Sexual Activity: Yes   Current Outpatient Medications on File Prior to Visit  Medication Sig Dispense Refill  . Insulin Pen Needle 31G X 6 MM MISC 1 Device by Does not apply route 2 (two) times daily. 60 each 3  . JARDIANCE 25 MG TABS tablet TAKE 1 TABLET BY MOUTH DAILY 90 tablet 0  . metFORMIN (GLUCOPHAGE-XR) 500 MG 24 hr tablet TAKE 1 TABLET(500 MG) BY MOUTH TWICE DAILY WITH A MEAL 180 tablet 3  . NOVOLOG FLEXPEN 100 UNIT/ML FlexPen INJECT 0 TO 6 UNITS UNDER THE SKIN THREE TIMES DAILY BEFORE MEALS. 15 mL 11  . OneTouch Delica Lancets 33G MISC Use 3x a day 300 each 3  . ONETOUCH VERIO test strip TEST FOUR TIMES DAILY AS DIRECTED 400 each 0  . ONETOUCH VERIO test strip USE AS INSTRUCTED TO CHECK SUGAR FOUR TIMES DAILY 400 strip 11  . TRESIBA FLEXTOUCH 200 UNIT/ML FlexTouch Pen INJECT 36 UNITS INTO THE SKIN DAILY 9 mL 2  . TRULICITY 1.5 MG/0.5ML SOPN INJECT 1.5MG (THE CONTENTS OF 1 SYRINGE) UNDER THE SKIN ONCE EVERY WEEK 2 mL 2   No current facility-administered medications on file prior to visit.   No Known Allergies    FH: - Maternal aunt with diabetes type 2, insulin-dependent - No known family history of autoimmune diseases.  PE: There were no vitals taken for this visit. There is no height or weight on file to calculate BMI. Wt Readings from Last 3 Encounters:  05/30/19 217 lb (98.4 kg)  01/26/19 216 lb (98 kg)  07/11/18 208 lb (94.3 kg)   Constitutional: overweight, in NAD Eyes: PERRLA, EOMI, no exophthalmos ENT: moist mucous membranes, no thyromegaly, no cervical lymphadenopathy Cardiovascular: RRR, No MRG Respiratory: CTA B Gastrointestinal: abdomen soft, NT, ND, BS+ Musculoskeletal: no deformities, strength intact in all 4 Skin: moist, warm, no rashes Neurological: no tremor with outstretched hands, DTR normal in all 4  ASSESSMENT: 1. DM2, insulin-dependent, with complications - Peripheral neuropathy  Component     Latest Ref Rng 05/29/2015  Glutamic Acid Decarb Ab     0.0 - 5.0 U/mL <5.0   Component     Latest Ref Rng 06/25/2015  C-Peptide     0.80-3.85 ng/mL 0.87  Glucose, Fasting     65 - 99 mg/dL 644 (H)  Pancreatic Islet Cell Antibody     < 5 JDF Units <5  Vitamin B12     211 - 911 pg/mL 261  Anti-pancreatic antibodies negative. C-peptide low normal. He may have a degree of insulin deficiency, but this was not clearly evident...  Component     Latest Ref Rng & Units 02/05/2016  Hemoglobin A1C      5.3  C-Peptide     0.80 - 3.85 ng/mL 1.33  Glucose, Fasting     65 - 99 mg/dL 97   Labs again indicated good insulin production...  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 Low B12  PLAN:  1. Patient with history of insulin-dependent type 2 diabetes with fair control on oral antidiabetic regimen with Metformin, SGLT2 inhibitor and also basal insulin and weekly GLP-1 receptor agonist.  We use NovoLog in the past for correction of high blood sugars but he did not have to use this consistently, so we stopped.  We checked him for type 1 diabetes twice and the investigation was negative.  At  last visit, sugars were better with only occasional mild hypoglycemia especially if he had a snack in the afternoon, but overall, with improved control, possibly related to improved anxiety after starting Lexapro.  We did not change his regimen at that time.  - I suggested to:  Patient Instructions  Please continue:  - Metformin ER 500 mg 2x a day with meals  - Jardiance 25 mg daily   - Trulicity 1.5 mg weekly  - Tresiba 36 units daily  Please return in 4 months with your sugar log.     - we checked his HbA1c: 7%  - advised to check sugars at different times of the day - 1x a day, rotating check times - advised for yearly eye exams >> he is UTD - return to clinic in 4 months   2. Peripheral neuropathy  -His symptoms improved significantly after he started but like acid and B12 -Continues on the above supplements  3.  Low B12 vitamin -His balance problem improved after starting supplementation with B12 -Continues on B12 1000 mcg daily -Latest B12 level was normal   Carlus Pavlov, MD PhD [facility] Endocrinology

## 2019-10-24 ENCOUNTER — Encounter: Payer: Self-pay | Admitting: Internal Medicine

## 2019-12-04 ENCOUNTER — Other Ambulatory Visit: Payer: Self-pay | Admitting: Internal Medicine

## 2019-12-12 ENCOUNTER — Other Ambulatory Visit: Payer: Self-pay | Admitting: Internal Medicine

## 2020-02-05 ENCOUNTER — Other Ambulatory Visit: Payer: Self-pay | Admitting: Internal Medicine

## 2020-03-13 ENCOUNTER — Encounter: Payer: Self-pay | Admitting: Internal Medicine

## 2020-03-13 ENCOUNTER — Ambulatory Visit: Payer: BC Managed Care – PPO | Admitting: Internal Medicine

## 2020-03-13 ENCOUNTER — Other Ambulatory Visit: Payer: Self-pay

## 2020-03-13 VITALS — BP 124/82 | HR 102 | Ht 73.0 in | Wt 230.8 lb

## 2020-03-13 DIAGNOSIS — Z23 Encounter for immunization: Secondary | ICD-10-CM

## 2020-03-13 DIAGNOSIS — E1165 Type 2 diabetes mellitus with hyperglycemia: Secondary | ICD-10-CM

## 2020-03-13 DIAGNOSIS — E538 Deficiency of other specified B group vitamins: Secondary | ICD-10-CM | POA: Diagnosis not present

## 2020-03-13 DIAGNOSIS — E1142 Type 2 diabetes mellitus with diabetic polyneuropathy: Secondary | ICD-10-CM

## 2020-03-13 DIAGNOSIS — IMO0002 Reserved for concepts with insufficient information to code with codable children: Secondary | ICD-10-CM

## 2020-03-13 LAB — POCT GLYCOSYLATED HEMOGLOBIN (HGB A1C): Hemoglobin A1C: 7.9 % — AB (ref 4.0–5.6)

## 2020-03-13 MED ORDER — ACCU-CHEK GUIDE VI STRP: ORAL_STRIP | 3 refills | Status: AC

## 2020-03-13 MED ORDER — ACCU-CHEK SOFTCLIX LANCETS MISC: 3 refills | Status: AC

## 2020-03-13 MED ORDER — TRULICITY 3 MG/0.5ML ~~LOC~~ SOAJ
3.0000 mg | SUBCUTANEOUS | 3 refills | Status: DC
Start: 2020-03-13 — End: 2021-01-12

## 2020-03-13 NOTE — Addendum Note (Signed)
Addended by: Kenyon Ana on: 03/13/2020 02:54 PM   Modules accepted: Orders

## 2020-03-13 NOTE — Patient Instructions (Addendum)
Please continue:  - Metformin ER 500 mg 2x a day with meals  - Jardiance 25 mg daily   - Tresiba 36 units daily  Please increase: - Trulicity  to 3 mg weekly  Please return in 4 months with your sugar log.

## 2020-03-13 NOTE — Progress Notes (Signed)
Patient ID: Greg Goodman, male   DOB: 10/17/1980, 39 y.o.   MRN: [id number]  This visit occurred during the SARS-CoV-2 public health emergency.  Safety protocols were in place, including screening questions prior to the visit, additional usage of staff PPE, and extensive cleaning of exam room while observing appropriate contact time as indicated for disinfecting solutions.   HPI: Greg Goodman is a 39 y.o.-year-old male, returning for follow-up for DM2, dx in 05/2015, insulin-dependent, uncontrolled, with complications (peripheral neuropathy). Last visit 9.5 months ago.  At last visit, he was just started on Lexapro by PCP.  Over the summer, sugars were higher >> used Novolog >> developed hypoglycemia >> figured out the meter was defective >> now got a new meter.  Reviewed history: In 05/2014 >> started to feel poorly, with dizziness, lightheadedness, and chest pressure. Retrospectively, he also had increased thirst, increased urination, increased hunger with unintentional weight loss (40 lbs in 1 year!), blurry vision.  He went to UC >> CBG: "HI" >> sent to ED: 561. He was hospitalized and then discharged on insulin.  He was in the ED 2x after the initial admission >> weak, blurry vision - 06/02/2015 (Non-ketotic Hgly - 200-300). Then, he was in the ED (High Pt Reg) 06/17/2015 >> same sxs (Non-ketotic Hgly - 200s).   Since then, he remained on a basal/bolus insulin regimen   He saw [name] for carb counting teaching.  He feels comfortable with this.   We checked him for type 1 diabetes 2x and the investigation was negative.  Reviewed HbA1c levels Lab Results  Component Value Date   HGBA1C 6.3 (A) 05/30/2019   HGBA1C 6.5 (A) 01/26/2019   HGBA1C 6.6 (A) 07/11/2018  08/03/2016: HbA1c calculated from the fructosamine is 7.17%.  He is on:  - Metformin ER 500 mg 2x a day with meals  - Jardiance 25 mg daily   - Trulicity 1.5 weekly   - Tresiba 36 units daily - Occasionally  Novolog 2-4 units for correction He tried regular Meformin >> bloating, AP  Pt checks his sugars 3 times a day per his carefully kept log: - am: 98-134, 150, 190-200 >> 102-140, 183 >> 92-136 >> 83, 85, 147 - 2h after b'fast: n/c >> 121-233 (sick) >>  118-163 >> 122 >> n/c - before lunch:  92-197, 217 >> 112-219 >> 65-140, 152 >> 114-143 - 2h after lunch:  277 (sick) >> 134, 184, 381 >> 205 >> 82, 158 - before dinner: 105-157, 182 >> 126-206 >> 120-164, 172 >> 130-179 - 2h after dinner: 127-171 > 114-121 >> n/c >> 120 >> n/c >> 99, 171, 262 (Txgiving) - bedtime:  74-156, 169 >> n/c >> 117 >> 114-132 >> n/c  - nighttime: n/c160 >> 85 Lowest sugar was 79 >> 98  >> 98 >> 92 >> 52 x1 (when had the defective meter); hypoglycemia awareness in the 80s. Highest sugar was 277 >> 381 >> 205 >> 262 (Txgiving).  Glucometer: [employer] IQ >> AccuChek Guide  Pt's meals are: - Breakfast: 2 eggs + English muffin or toast or cereals (6 units) - Lunch: sandwich wrap or salad (4 units) - Dinner: chicken + salad + rice (6 units)  - Snacks: rice cakes He drinks diet sodas  No CKD, last BUN/creatinine:  05/08/2019: 13/0.8, GFR 109, glucose 141 Lab Results  Component Value Date   BUN 15 01/26/2019   CREATININE 0.98 01/26/2019   + HL: Last set of lipids: 05/08/2019: 190/111/40/130 Lab Results  Component Value  Date   CHOL 175 01/26/2019   HDL 35 (L) 01/26/2019   LDLCALC 118 (H) 01/26/2019   TRIG 108 01/26/2019   CHOLHDL 5.0 (H) 01/26/2019  He is not on a statin. - last eye exam was 07/2019: No DR -+ Slight numbness and tingling in his feet Previously on Neurontin, then off His symptoms improved after starting on alpha lipoic acid and B12 vitamin.  In 04/2017, we started B12 1000 mcg daily.  Reviewed vitamin B12 levels: 05/08/2019: Vitamin B12 415 Lab Results  Component Value Date   VITAMINB12 616 01/26/2019   VITAMINB12 946 (H) 12/02/2017   VITAMINB12 383 04/14/2017   VITAMINB12 261  06/25/2015   Reviewed his TFTs: 05/08/2019: TSH 2.21 Lab Results  Component Value Date   TSH 1.55 01/26/2019   He works as an Tax inspector.  ROS: Constitutional: no weight gain/no weight loss, no fatigue, no subjective hyperthermia, no subjective hypothermia Eyes: no blurry vision, no xerophthalmia ENT: no sore throat, no nodules palpated in neck, no dysphagia, no odynophagia, no hoarseness Cardiovascular: no CP/no SOB/no palpitations/no leg swelling Respiratory: no cough/no SOB/no wheezing Gastrointestinal: no N/no V/no D/no C/no acid reflux Musculoskeletal: no muscle aches/no joint aches Skin: no rashes, no hair loss Neurological: no tremors/+ numbness/+ tingling/no dizziness  I reviewed pt's medications, allergies, PMH, social hx, family hx, and changes were documented in the history of present illness. Otherwise, unchanged from my initial visit note.  Past Medical History:  Diagnosis Date  . Anxiety   . Depression   . Diabetes mellitus without complication (HCC) 05/2015   NEW ONSET   Past Surgical History:  Procedure Laterality Date  . APPENDECTOMY     Social History   Social History  . Marital Status: Single    Spouse Name: N/A  . Number of Children: 0   Occupational History  .  English teacher    Social History Main Topics  . Smoking status: Former Smoker -- 0.25 packs/day - quit 2017  . Smokeless tobacco: Never Used  . Alcohol Use: Yes     Comment: occ  . Drug Use: No  . Sexual Activity: Yes   Current Outpatient Medications on File Prior to Visit  Medication Sig Dispense Refill  . Insulin Pen Needle 31G X 6 MM MISC 1 Device by Does not apply route 2 (two) times daily. 60 each 3  . JARDIANCE 25 MG TABS tablet TAKE 1 TABLET BY MOUTH DAILY 90 tablet 0  . metFORMIN (GLUCOPHAGE-XR) 500 MG 24 hr tablet TAKE 1 TABLET(500 MG) BY MOUTH TWICE DAILY WITH A MEAL 180 tablet 3  . NOVOLOG FLEXPEN 100 UNIT/ML FlexPen INJECT 0 TO 6 UNITS UNDER THE SKIN THREE TIMES  DAILY BEFORE MEALS. 15 mL 11  . OneTouch Delica Lancets 33G MISC Use 3x a day 300 each 3  . ONETOUCH VERIO test strip TEST FOUR TIMES DAILY AS DIRECTED 400 each 0  . ONETOUCH VERIO test strip USE AS INSTRUCTED TO CHECK SUGAR FOUR TIMES DAILY 400 strip 11  . TRESIBA FLEXTOUCH 200 UNIT/ML FlexTouch Pen INJECT 36 UNITS INTO THE SKIN DAILY 9 mL 0  . TRULICITY 1.5 MG/0.5ML SOPN INJECT 1.5MG (THE CONTENTS OF 1 SYRINGE) UNDER THE SKIN ONCE EVERY WEEK 2 mL 2   No current facility-administered medications on file prior to visit.   No Known Allergies   FH: - Maternal aunt with diabetes type 2, insulin-dependent - No known family history of autoimmune diseases.  PE: BP 124/82   Pulse (!) 102  Ht 6\' 1"  (1.854 m)   Wt 230 lb 12.8 oz (104.7 kg)   SpO2 96%   BMI 30.45 kg/m  Body mass index is 30.45 kg/m. Wt Readings from Last 3 Encounters:  03/13/20 230 lb 12.8 oz (104.7 kg)  05/30/19 217 lb (98.4 kg)  01/26/19 216 lb (98 kg)   Constitutional: overweight, in NAD Eyes: PERRLA, EOMI, no exophthalmos ENT: moist mucous membranes, no thyromegaly, no cervical lymphadenopathy Cardiovascular: RRR, No MRG Respiratory: CTA B Gastrointestinal: abdomen soft, NT, ND, BS+ Musculoskeletal: no deformities, strength intact in all 4 Skin: moist, warm, no rashes Neurological: no tremor with outstretched hands, DTR normal in all 4  ASSESSMENT: 1. DM2, insulin-dependent, with complications - Peripheral neuropathy  Component     Latest Ref Rng 05/29/2015  Glutamic Acid Decarb Ab     0.0 - 5.0 U/mL <5.0   Component     Latest Ref Rng 06/25/2015  C-Peptide     0.80-3.85 ng/mL 0.87  Glucose, Fasting     65 - 99 mg/dL 01-17-1987 (H)  Pancreatic Islet Cell Antibody     < 5 JDF Units <5  Vitamin B12     211 - 911 pg/mL 261  Anti-pancreatic antibodies negative. C-peptide low normal. He may have a degree of insulin deficiency, but this was not clearly evident...  Component     Latest Ref Rng & Units  02/05/2016  Hemoglobin A1C      5.3  C-Peptide     0.80 - 3.85 ng/mL 1.33  Glucose, Fasting     65 - 99 mg/dL 97   Labs again indicated good insulin production...  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 Low B12  PLAN:  1. Patient with history of insulin-dependent type 2 diabetes with fair control on oral antidiabetic regimen with Metformin, Jardiance and also on long-acting daily insulin and weekly GLP-1 receptor agonist.  We used NovoLog in the past for correction of high blood sugars but he did not have to use this consistently, so we stopped.  We checked him for type 1 diabetes twice and the investigation was negative.  At last visit, sugars were better with only occasional mild hyperglycemia especially if he had a snack in the afternoon, but overall, with improved control, possibly related to improved anxiety after starting Lexapro.  We did not change his regimen at that time.  HbA1c was 6.3%.  Since then, he no showed 2 appointments. - at todays' visit, sugars are consistently higher than goal in 01/2020, but they improved in the last 3 weeks, with only few sugars higher than goal, one of them in the 260s after Thanksgiving dinner.  However, since he still has sugars above goal and he tells me that he still uses few units of NovoLog for correction, especially since he also gained weight since last visit, we will increase his Trulicity dose to 3 mg weekly.  Of note, he tolerates the medication well. - I suggested to:  Patient Instructions  Please continue:  - Metformin ER 500 mg 2x a day with meals  - Jardiance 25 mg daily   - Tresiba 36 units daily  Please increase: - Trulicity  to 3 mg weekly  Please return in 4 months with your sugar log.   - we checked his HbA1c: 7.9% (higher) - advised to check sugars at different times of the day - 2x a day, rotating check times - advised for yearly eye exams >> he is UTD - He has an appointment coming  up with PCP, will have annual labs then - return to clinic  in 4 months   2. Peripheral neuropathy  -His symptoms improved significantly after he started alpha-lipoic acid and B12 -He continues on the above supplements  3.  Low B12 vitamin -He has balance problems improved after starting supplementation with B12 -Continues on B12 1000 mcg daily -Latest B12 level was normal in 04/2019.    Greg Pavlov, MD PhD [facility] Endocrinology

## 2020-03-24 ENCOUNTER — Other Ambulatory Visit: Payer: Self-pay | Admitting: Internal Medicine

## 2020-04-11 ENCOUNTER — Other Ambulatory Visit: Payer: Self-pay | Admitting: Internal Medicine

## 2020-05-20 ENCOUNTER — Other Ambulatory Visit: Payer: Self-pay | Admitting: Internal Medicine

## 2020-07-15 ENCOUNTER — Encounter: Payer: Self-pay | Admitting: Internal Medicine

## 2020-07-15 DIAGNOSIS — IMO0002 Reserved for concepts with insufficient information to code with codable children: Secondary | ICD-10-CM

## 2020-07-15 DIAGNOSIS — E1165 Type 2 diabetes mellitus with hyperglycemia: Secondary | ICD-10-CM

## 2020-07-16 MED ORDER — TRESIBA FLEXTOUCH 200 UNIT/ML ~~LOC~~ SOPN: PEN_INJECTOR | SUBCUTANEOUS | 0 refills | Status: DC

## 2020-07-25 ENCOUNTER — Ambulatory Visit: Payer: BC Managed Care – PPO | Admitting: Internal Medicine

## 2020-07-28 ENCOUNTER — Other Ambulatory Visit: Payer: Self-pay | Admitting: Internal Medicine

## 2020-07-28 DIAGNOSIS — IMO0002 Reserved for concepts with insufficient information to code with codable children: Secondary | ICD-10-CM

## 2020-07-28 DIAGNOSIS — E1142 Type 2 diabetes mellitus with diabetic polyneuropathy: Secondary | ICD-10-CM

## 2020-08-27 ENCOUNTER — Other Ambulatory Visit: Payer: Self-pay | Admitting: Internal Medicine

## 2020-10-28 ENCOUNTER — Other Ambulatory Visit: Payer: Self-pay | Admitting: Internal Medicine

## 2020-11-27 ENCOUNTER — Other Ambulatory Visit: Payer: Self-pay | Admitting: Internal Medicine

## 2021-01-01 ENCOUNTER — Other Ambulatory Visit: Payer: Self-pay | Admitting: Internal Medicine

## 2021-01-01 NOTE — Telephone Encounter (Signed)
Vm left for patient to callback and schedule appt  

## 2021-01-07 ENCOUNTER — Other Ambulatory Visit: Payer: Self-pay | Admitting: Internal Medicine

## 2021-01-12 ENCOUNTER — Encounter: Payer: Self-pay | Admitting: Internal Medicine

## 2021-01-12 ENCOUNTER — Other Ambulatory Visit: Payer: Self-pay

## 2021-01-12 ENCOUNTER — Ambulatory Visit (INDEPENDENT_AMBULATORY_CARE_PROVIDER_SITE_OTHER): Payer: BC Managed Care – PPO | Admitting: Internal Medicine

## 2021-01-12 VITALS — BP 124/90 | HR 92 | Ht 73.0 in | Wt 214.2 lb

## 2021-01-12 DIAGNOSIS — E538 Deficiency of other specified B group vitamins: Secondary | ICD-10-CM

## 2021-01-12 DIAGNOSIS — E1142 Type 2 diabetes mellitus with diabetic polyneuropathy: Secondary | ICD-10-CM | POA: Diagnosis not present

## 2021-01-12 DIAGNOSIS — E1165 Type 2 diabetes mellitus with hyperglycemia: Secondary | ICD-10-CM | POA: Diagnosis not present

## 2021-01-12 LAB — LIPID PANEL
Cholesterol: 206 mg/dL — ABNORMAL HIGH (ref 0–200)
HDL: 49.3 mg/dL (ref >39.00)
LDL Cholesterol: 136 mg/dL — ABNORMAL HIGH (ref 0–99)
NonHDL: 156.81
Total CHOL/HDL Ratio: 4
Triglycerides: 103 mg/dL (ref 0.0–149.0)
VLDL: 20.6 mg/dL (ref 0.0–40.0)

## 2021-01-12 LAB — COMPREHENSIVE METABOLIC PANEL
ALT: 58 U/L — ABNORMAL HIGH (ref 0–53)
AST: 30 U/L (ref 0–37)
Albumin: 4.7 g/dL (ref 3.5–5.2)
Alkaline Phosphatase: 78 U/L (ref 39–117)
BUN: 13 mg/dL (ref 6–23)
CO2: 27 mEq/L (ref 19–32)
Calcium: 9.8 mg/dL (ref 8.4–10.5)
Chloride: 102 mEq/L (ref 96–112)
Creatinine, Ser: 0.98 mg/dL (ref 0.40–1.50)
GFR: 97.6 mL/min (ref >60.00)
Glucose, Bld: 105 mg/dL — ABNORMAL HIGH (ref 70–99)
Potassium: 4.1 mEq/L (ref 3.5–5.1)
Sodium: 139 mEq/L (ref 135–145)
Total Bilirubin: 0.5 mg/dL (ref 0.2–1.2)
Total Protein: 7.7 g/dL (ref 6.0–8.3)

## 2021-01-12 LAB — VITAMIN B12: Vitamin B-12: 246 pg/mL (ref 211–911)

## 2021-01-12 LAB — MICROALBUMIN / CREATININE URINE RATIO
Creatinine,U: 127.2 mg/dL
Microalb Creat Ratio: 1.6 mg/g (ref 0.0–30.0)
Microalb, Ur: 2 mg/dL — ABNORMAL HIGH (ref 0.0–1.9)

## 2021-01-12 LAB — POCT GLYCOSYLATED HEMOGLOBIN (HGB A1C): Hemoglobin A1C: 8 % — AB (ref 4.0–5.6)

## 2021-01-12 MED ORDER — EMPAGLIFLOZIN 25 MG PO TABS: 25.0000 mg | ORAL_TABLET | Freq: Every day | ORAL | 3 refills | Status: DC

## 2021-01-12 MED ORDER — METFORMIN HCL ER 500 MG PO TB24: ORAL_TABLET | ORAL | 3 refills | Status: DC

## 2021-01-12 MED ORDER — TRESIBA FLEXTOUCH 200 UNIT/ML ~~LOC~~ SOPN: PEN_INJECTOR | SUBCUTANEOUS | 3 refills | Status: DC

## 2021-01-12 MED ORDER — NOVOLOG FLEXPEN 100 UNIT/ML ~~LOC~~ SOPN: PEN_INJECTOR | SUBCUTANEOUS | 3 refills | Status: DC

## 2021-01-12 MED ORDER — TRULICITY 3 MG/0.5ML ~~LOC~~ SOAJ: 3.0000 mg | SUBCUTANEOUS | 3 refills | Status: DC

## 2021-01-12 MED ORDER — FREESTYLE LIBRE 2 SENSOR MISC: 1.0000 | 3 refills | Status: DC

## 2021-01-12 NOTE — Progress Notes (Signed)
Patient ID: Greg Goodman, male   DOB: Dec 30, 1980, 40 y.o.   MRN: [id number]  This visit occurred during the SARS-CoV-2 public health emergency.  Safety protocols were in place, including screening questions prior to the visit, additional usage of staff PPE, and extensive cleaning of exam room while observing appropriate contact time as indicated for disinfecting solutions.   HPI: Greg Goodman is a 40 y.o.-year-old male, returning for follow-up for DM2, dx in 05/2015, insulin-dependent, uncontrolled, with complications (peripheral neuropathy). Last visit 10 months ago.  Previous visit 9.5 months ago.  Interim history: No increased urination, blurry vision, nausea, chest pain. He had Covid19 in 09/2020. He had vertigo, dizziness, nausea, mm aches, HA, fever (103F).  Sugars were worse (300s).  Symptoms are now resolved including his vertigo.  Sugars started to improve but some of them are still high.  Reviewed history: In 05/2014 >> started to feel poorly, with dizziness, lightheadedness, and chest pressure. Retrospectively, he also had increased thirst, increased urination, increased hunger with unintentional weight loss (40 lbs in 1 year!), blurry vision.  He went to UC >> CBG: "HI" >> sent to ED: 561. He was hospitalized and then discharged on insulin.  He was in the ED 2x after the initial admission >> weak, blurry vision - 06/02/2015 (Non-ketotic Hgly - 200-300). Then, he was in the ED (High Pt Reg) 06/17/2015 >> same sxs (Non-ketotic Hgly - 200s).   Since then, he remained on a basal/bolus insulin regimen   He saw Pincus Large for carb counting teaching.  He feels comfortable with this.   We checked him for type 1 diabetes 2x and the investigation was negative.  Reviewed HbA1c levels Lab Results  Component Value Date   HGBA1C 7.9 (A) 03/13/2020   HGBA1C 6.3 (A) 05/30/2019   HGBA1C 6.5 (A) 01/26/2019  08/03/2016: HbA1c calculated from the fructosamine is 7.17%.  He is on:  -  Metformin ER 500 mg 2x a day with meals  - Jardiance 25 mg daily   - Trulicity 1.5 >> 3 weekly   - Tresiba 36 units daily - Occasionally Novolog 2-4 units for correction He tried regular Meformin >> bloating, AP  Pt checks his sugars 3 times a day per his meter download: - am:  102-140, 183 >> 92-136 >> 83, 85, 147 >> 80, 326 - 2h after b'fast: 121-233 (sick) >>  118-163 >> 122 >> n/c >> 139, 216 - before lunch: 112-219 >> 65-140, 152 >> 114-143 >> 108-169 - 2h after lunch:   134, 184, 381 >> 205 >> 82, 158 >> 162, 190 - before dinner: 126-206 >> 120-164, 172 >> 130-179 >> 84-148 - 2h after dinner:  n/c >> 120 >> n/c >> 99, 171, 262 >> 129-268 - bedtime:  74-156, 169 >> n/c >> 117 >> 114-132 >> n/c  - nighttime: n/c160 >> 85 Lowest sugar was 79 >> 98  >> 98 >> 92 >> 52 x1 (when had the defective meter) >> 84; hypoglycemia awareness in the 80s. Highest sugar was 277 >> 381 >> 205 >> 262 (Txgiving) ?>> 326.  Glucometer: OneTouch Verio IQ >> AccuChek Guide  Pt's meals are: - Breakfast: 2 eggs + English muffin or toast or cereals (6 units) - Lunch: sandwich wrap or salad (4 units) - Dinner: chicken + salad + rice (6 units)  - Snacks: rice cakes He drinks diet sodas  No CKD, last BUN/creatinine:  05/08/2019: 13/0.8, GFR 109, glucose 141 Lab Results  Component Value Date   BUN 15  01/26/2019   CREATININE 0.98 01/26/2019   + HL: Last set of lipids: 05/08/2019: 190/111/40/130 Lab Results  Component Value Date   CHOL 175 01/26/2019   HDL 35 (L) 01/26/2019   LDLCALC 118 (H) 01/26/2019   TRIG 108 01/26/2019   CHOLHDL 5.0 (H) 01/26/2019  He is not on a statin.  - last eye exam was 07/2019: No DR  -+ Slight numbness and tingling in his feet Previously on Neurontin, then off His symptoms improved after starting on alpha lipoic acid and B12 vitamin.  In 04/2017, we started B12 1000 mcg daily.  Reviewed vitamin B12 levels: 05/08/2019: Vitamin B12 415 Lab Results  Component Value  Date   VITAMINB12 616 01/26/2019   VITAMINB12 946 (H) 12/02/2017   VITAMINB12 383 04/14/2017   VITAMINB12 261 06/25/2015   Reviewed his TFTs: 05/08/2019: TSH 2.21 Lab Results  Component Value Date   TSH 1.55 01/26/2019   He works as an Tax inspector.  ROS: + see HPI Neurological: no tremors/+ numbness/+ tingling/no dizziness  I reviewed pt's medications, allergies, PMH, social hx, family hx, and changes were documented in the history of present illness. Otherwise, unchanged from my initial visit note.  Past Medical History:  Diagnosis Date   Anxiety    Depression    Diabetes mellitus without complication (HCC) 05/2015   NEW ONSET   Past Surgical History:  Procedure Laterality Date   APPENDECTOMY     Social History   Social History   Marital Status: Single    Spouse Name: N/A   Number of Children: 0   Occupational History    Retail buyer    Social History Main Topics   Smoking status: Former Smoker -- 0.25 packs/day - quit 2017   Smokeless tobacco: Never Used   Alcohol Use: Yes     Comment: occ   Drug Use: No   Sexual Activity: Yes   Current Outpatient Medications on File Prior to Visit  Medication Sig Dispense Refill   Accu-Chek Softclix Lancets lancets Use as instructed 2x a day 100 each 3   BD PEN NEEDLE NANO 2ND GEN 32G X 4 MM MISC USE AS DIRECTED TWICE DAILY 100 each 5   Dulaglutide (TRULICITY) 3 MG/0.5ML SOPN Inject 3 mg into the skin once a week. 6 mL 3   glucose blood (ACCU-CHEK GUIDE) test strip Use as instructed 2x a day 100 each 3   Insulin Pen Needle 31G X 6 MM MISC 1 Device by Does not apply route 2 (two) times daily. 60 each 3   JARDIANCE 25 MG TABS tablet TAKE 1 TABLET BY MOUTH DAILY 90 tablet 1   metFORMIN (GLUCOPHAGE-XR) 500 MG 24 hr tablet TAKE 1 TABLET(500 MG) BY MOUTH TWICE DAILY WITH A MEAL 180 tablet 1   NOVOLOG FLEXPEN 100 UNIT/ML FlexPen ADMINISTER 0 TO 6 UNITS UNDER THE SKIN THREE TIMES DAILY BEFORE MEALS. NEED TO MAKE AN  APPOINTMENT 15 mL 0   TRESIBA FLEXTOUCH 200 UNIT/ML FlexTouch Pen ADMINISTER 36 UNITS UNDER THE SKIN DAILY 9 mL 0   No current facility-administered medications on file prior to visit.   No Known Allergies   FH: - Maternal aunt with diabetes type 2, insulin-dependent - No known family history of autoimmune diseases.  PE: BP 124/90 (BP Location: Right Arm, Patient Position: Sitting, Cuff Size: Normal)   Pulse 92   Ht 6\' 1"  (1.854 m)   Wt 214 lb 3.2 oz (97.2 kg)   SpO2 98%   BMI 28.26 kg/m  Body mass index is 28.26 kg/m. Wt Readings from Last 3 Encounters:  01/12/21 214 lb 3.2 oz (97.2 kg)  03/13/20 230 lb 12.8 oz (104.7 kg)  05/30/19 217 lb (98.4 kg)   Constitutional: overweight, in NAD Eyes: PERRLA, EOMI, no exophthalmos ENT: moist mucous membranes, no thyromegaly, no cervical lymphadenopathy Cardiovascular: RRR, No MRG Respiratory: CTA B Gastrointestinal: abdomen soft, NT, ND, BS+ Musculoskeletal: no deformities, strength intact in all 4 Skin: moist, warm, no rashes Neurological: no tremor with outstretched hands, DTR normal in all 4  ASSESSMENT: 1. DM2, insulin-dependent, with complications - Peripheral neuropathy  Component     Latest Ref Rng 05/29/2015  Glutamic Acid Decarb Ab     0.0 - 5.0 U/mL <5.0   Component     Latest Ref Rng 06/25/2015  C-Peptide     0.80-3.85 ng/mL 0.87  Glucose, Fasting     65 - 99 mg/dL 474 (H)  Pancreatic Islet Cell Antibody     < 5 JDF Units <5  Vitamin B12     211 - 911 pg/mL 261  Anti-pancreatic antibodies negative. C-peptide low normal. He may have a degree of insulin deficiency, but this was not clearly evident...  Component     Latest Ref Rng & Units 02/05/2016  Hemoglobin A1C      5.3  C-Peptide     0.80 - 3.85 ng/mL 1.33  Glucose, Fasting     65 - 99 mg/dL 97   Labs again indicated good insulin production...  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 Low B12  PLAN:  1. Patient with uncontrolled insulin-dependent type 2 diabetes,  returning after another long absence.  He continues on metformin, SGLT2 inhibitor, weekly GLP-1 receptor agonist and long-acting insulin.  He is also occasionally taking 2 units of NovoLog for correction. -At last visit, HbA1c was higher, at 7.9%.  We increased his Trulicity dose to 3 mg weekly.  He tolerates the medication well. -At today's visit, per review of his meter downloads, sugars have indeed been higher, even to 300s around the time of his COVID diagnosis.  However, since then, they started to improve but they are still not at target, he has occasional high blood sugars in the 200s especially after dinner.  Therefore, for now, I suggested to take NovoLog before larger meals (high carb meals, meals eaten out), 6 to 10 units and continue with taking it otherwise only for correction.  Otherwise, we can continue the current regimen. -He is interested in the freestyle libre CGM and I sent a prescription to his pharmacy.  I feel that this will help with diabetes improvement.  If not, we may need to add NovoLog before each meal at next visit. - I suggested to:  Patient Instructions  Please continue:  - Metformin ER 500 mg 2x a day with meals  - Jardiance 25 mg daily   - Tresiba 36 units daily - Trulicity 3 mg weekly  Please add: - Novolog 6-10 units before dinner.  Try to start a CGM.  Please return in 4 months with your sugar log.   - we checked his HbA1c: 8% (slightly higher) - advised to check sugars at different times of the day - 1-2x a day, rotating check times - advised for yearly eye exams >> he is not UTD - will check annual labs today - refilled his diabetic prescriptions - return to clinic in 4 months   2. Peripheral neuropathy  -His symptoms improved significantly after he started alpha-lipoic acid  and B12 -He continues on the above supplements  3.  Low B12 vitamin -He has balance problems improved after starting supplementation with B12 -He continues on B12 1000 mcg  daily -Latest B12 level was normal in 04/2019 -We will recheck this today  Component     Latest Ref Rng & Units 01/12/2021  Sodium     135 - 145 mEq/L 139  Potassium     3.5 - 5.1 mEq/L 4.1  Chloride     96 - 112 mEq/L 102  CO2     19 - 32 mEq/L 27  Glucose     70 - 99 mg/dL 297 (H)  BUN     6 - 23 mg/dL 13  Creatinine     9.89 - 1.50 mg/dL 2.11  Total Bilirubin     0.2 - 1.2 mg/dL 0.5  Alkaline Phosphatase     39 - 117 U/L 78  AST     0 - 37 U/L 30  ALT     0 - 53 U/L 58 (H)  Total Protein     6.0 - 8.3 g/dL 7.7  Albumin     3.5 - 5.2 g/dL 4.7  GFR     >94.17 mL/min 97.60  Calcium     8.4 - 10.5 mg/dL 9.8  Cholesterol     0 - 200 mg/dL 408 (H)  Triglycerides     0.0 - 149.0 mg/dL 144.8  HDL Cholesterol     >39.00 mg/dL 18.56  VLDL     0.0 - 31.4 mg/dL 97.0  LDL (calc)     0 - 99 mg/dL 263 (H)  Total CHOL/HDL Ratio      4  NonHDL      156.81  Microalb, Ur     0.0 - 1.9 mg/dL 2.0 (H)  Creatinine,U     mg/dL 785.8  MICROALB/CREAT RATIO     0.0 - 30.0 mg/g 1.6  Vitamin B12     211 - 911 pg/mL 246   LDL is elevated -we discussed about improving diet and also diabetes control and will need to recheck afterwards. His ALT is also elevated, which is not new.  The rest of the labs are normal.  B12 is at the low end of the target range.  Carlus Pavlov, MD PhD [facility]

## 2021-01-12 NOTE — Patient Instructions (Addendum)
Please continue:  - Metformin ER 500 mg 2x a day with meals  - Jardiance 25 mg daily   - Tresiba 36 units daily - Trulicity 3 mg weekly  Please add: - Novolog 6-10 units before dinner.  Try to start a CGM.  Please return in 4 months with your sugar log.

## 2021-01-19 ENCOUNTER — Telehealth: Payer: Self-pay | Admitting: Internal Medicine

## 2021-01-19 NOTE — Telephone Encounter (Signed)
Patient called to advise that not all RX's from 01/12/2021 were received by [facility]. Please resend:Trulicity and Evaristo [facility] - Pharmacy is the [facility] on [address].  Call back [phone]

## 2021-01-20 MED ORDER — TRESIBA FLEXTOUCH 200 UNIT/ML ~~LOC~~ SOPN
PEN_INJECTOR | SUBCUTANEOUS | 3 refills | Status: DC
Start: 2021-01-20 — End: 2021-09-10

## 2021-01-20 MED ORDER — TRULICITY 3 MG/0.5ML ~~LOC~~ SOAJ: 3.0000 mg | SUBCUTANEOUS | 3 refills | Status: DC

## 2021-01-20 NOTE — Telephone Encounter (Signed)
Script resent

## 2021-01-21 ENCOUNTER — Telehealth: Payer: Self-pay | Admitting: Pharmacy Technician

## 2021-01-21 ENCOUNTER — Other Ambulatory Visit (HOSPITAL_COMMUNITY): Payer: Self-pay

## 2021-01-21 NOTE — Telephone Encounter (Signed)
Patient Advocate Encounter   Received notification from [employer] that prior authorization for TRULICITY 3MG  is required.   Sent to plan 10.12.22 Key:  [id number]    [facility] will continue to follow   12.12.22, CPhT Patient Advocate [facility] Phone: [phone] Fax:  [phone]

## 2021-01-26 ENCOUNTER — Other Ambulatory Visit (HOSPITAL_COMMUNITY): Payer: Self-pay

## 2021-01-26 NOTE — Telephone Encounter (Signed)
[facility] Patient Advocate Encounter  Prior Authorization for tulicity 3mg  has been resolved per CMM.   It says no further PA is needed at this time. No date range that it's approved through at this time. Test claim shows it's too soon to fill. Pt got a 3 month supply 11/27/20  11/29/20, CPhT Patient Advocate [facility] Phone: [phone] Fax:  [phone]

## 2021-04-15 ENCOUNTER — Encounter: Payer: Self-pay | Admitting: Internal Medicine

## 2021-04-27 ENCOUNTER — Encounter: Payer: Self-pay | Admitting: Internal Medicine

## 2021-05-19 ENCOUNTER — Other Ambulatory Visit: Payer: Self-pay

## 2021-05-19 ENCOUNTER — Encounter: Payer: Self-pay | Admitting: Internal Medicine

## 2021-05-19 ENCOUNTER — Ambulatory Visit: Payer: BC Managed Care – PPO | Admitting: Internal Medicine

## 2021-05-19 VITALS — BP 128/90 | HR 106 | Ht 73.0 in | Wt 222.2 lb

## 2021-05-19 DIAGNOSIS — E538 Deficiency of other specified B group vitamins: Secondary | ICD-10-CM | POA: Diagnosis not present

## 2021-05-19 DIAGNOSIS — E1165 Type 2 diabetes mellitus with hyperglycemia: Secondary | ICD-10-CM | POA: Diagnosis not present

## 2021-05-19 DIAGNOSIS — E1142 Type 2 diabetes mellitus with diabetic polyneuropathy: Secondary | ICD-10-CM

## 2021-05-19 LAB — POCT GLYCOSYLATED HEMOGLOBIN (HGB A1C): Hemoglobin A1C: 6.3 % — AB (ref 4.0–5.6)

## 2021-05-19 MED ORDER — FREESTYLE LIBRE 3 SENSOR MISC: 1.0000 | 3 refills | Status: DC

## 2021-05-19 NOTE — Progress Notes (Signed)
Patient ID: Greg Goodman, male   DOB: 06/20/1980, 41 y.o.   MRN: [id number]  This visit occurred during the SARS-CoV-2 public health emergency.  Safety protocols were in place, including screening questions prior to the visit, additional usage of staff PPE, and extensive cleaning of exam room while observing appropriate contact time as indicated for disinfecting solutions.   HPI: Greg Goodman is a 41 y.o.-year-old male, returning for follow-up for DM2, dx in 05/2015, insulin-dependent, uncontrolled, with complications (peripheral neuropathy). Last visit 4 mo ago.  Interim history: No increased urination, blurry vision, nausea, chest pain. 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 Since last visit he also started a CGM.  He loves it.  Reviewed history: In 05/2014 >> started to feel poorly, with dizziness, lightheadedness, and chest pressure. Retrospectively, he also had increased thirst, increased urination, increased hunger with unintentional weight loss (40 lbs in 1 year!), blurry vision.  He went to UC >> CBG: "HI" >> sent to ED: 561. He was hospitalized and then discharged on insulin.  He was in the ED 2x after the initial admission >> weak, blurry vision - 06/02/2015 (Non-ketotic Hgly - 200-300). Then, he was in the ED (High Pt Reg) 06/17/2015 >> same sxs (Non-ketotic Hgly - 200s).   Since then, he remained on a basal/bolus insulin regimen.   He saw Pincus Large for carb counting teaching.  He feels comfortable with this.   We checked him for type 1 diabetes 2x and the investigation was negative.  Reviewed HbA1c levels Lab Results  Component Value Date   HGBA1C 8.0 (A) 01/12/2021   HGBA1C 7.9 (A) 03/13/2020   HGBA1C 6.3 (A) 05/30/2019  08/03/2016: HbA1c calculated from the fructosamine is 7.17%.  He is on:  - Metformin ER 500 mg 2x a day with meals  - Jardiance 25 mg daily   - Trulicity 1.5 >>  3 weekly   - Tresiba 36 units daily - Occasionally Novolog 2-4 units for correction; added 6-10 units NovoLog before dinner >> now 4 units NovoLog as needed He tried regular Meformin >> bloating, AP  Pt checks his sugars more than 4 times a day per review of his CGM downloads:   Previously - am:  102-140, 183 >> 92-136 >> 83, 85, 147 >> 80, 326 - 2h after b'fast: 121-233 (sick) >>  118-163 >> 122 >> n/c >> 139, 216 - before lunch: 112-219 >> 65-140, 152 >> 114-143 >> 108-169 - 2h after lunch:   134, 184, 381 >> 205 >> 82, 158 >> 162, 190 - before dinner: 126-206 >> 120-164, 172 >> 130-179 >> 84-148 - 2h after dinner:  n/c >> 120 >> n/c >> 99, 171, 262 >> 129-268 - bedtime:  74-156, 169 >> n/c >> 117 >> 114-132 >> n/c  - nighttime: n/c160 >> 85 Lowest sugar was 79 >> 98  >> 98 >> 92 >> 52 x1 (when had the defective meter) >> 84 >> (CGM); hypoglycemia awareness in the 80s. Highest sugar was 277 >> 381 >> 205 >> 262 (Txgiving) ?>> 326 >> 207.  Glucometer: OneTouch Verio IQ >> AccuChek Guide  Pt's meals are: - Breakfast: 2 eggs + English muffin or toast or cereals (6 units) - Lunch: sandwich wrap or salad (4 units) - Dinner: chicken + salad + rice (6 units)  - Snacks: rice cakes He drinks diet sodas  No CKD, last BUN/creatinine:  Lab Results  Component Value Date  BUN 13 01/12/2021   CREATININE 0.98 01/12/2021   + HL: Last set of lipids: Lab Results  Component Value Date   CHOL 206 (H) 01/12/2021   HDL 49.30 01/12/2021   LDLCALC 136 (H) 01/12/2021   TRIG 103.0 01/12/2021   CHOLHDL 4 01/12/2021  05/08/2019: 190/111/40/130 He is not on a statin.  - last eye exam was 07/2019: No DR  -+ Slight numbness and tingling in his feet  Previously on Neurontin, then off His symptoms improved after starting on alpha lipoic acid and B12 vitamin.  In 04/2017, we started B12 1000 mcg daily.  Reviewed vitamin B12 levels: Lab Results  Component Value Date   VITAMINB12 246  01/12/2021   VITAMINB12 616 01/26/2019   VITAMINB12 946 (H) 12/02/2017   VITAMINB12 383 04/14/2017   VITAMINB12 261 06/25/2015  05/08/2019: Vitamin B12 415  Reviewed his TFTs: 05/08/2019: TSH 2.21 Lab Results  Component Value Date   TSH 1.55 01/26/2019   He had Covid19 in 09/2020. He had vertigo, dizziness, nausea, mm aches, HA, fever (103F).  Sugars were worse (300s).  He works as an Tax inspector.  ROS: + see HPI Neurological: no tremors/+ numbness/+ tingling/no dizziness  I reviewed pt's medications, allergies, PMH, social hx, family hx, and changes were documented in the history of present illness. Otherwise, unchanged from my initial visit note.  Past Medical History:  Diagnosis Date   Anxiety    Depression    Diabetes mellitus without complication (HCC) 05/2015   NEW ONSET   Past Surgical History:  Procedure Laterality Date   APPENDECTOMY     Social History   Social History   Marital Status: Single    Spouse Name: N/A   Number of Children: 0   Occupational History    Retail buyer    Social History Main Topics   Smoking status: Former Smoker -- 0.25 packs/day - quit 2017   Smokeless tobacco: Never Used   Alcohol Use: Yes     Comment: occ   Drug Use: No   Sexual Activity: Yes   Current Outpatient Medications on File Prior to Visit  Medication Sig Dispense Refill   Accu-Chek Softclix Lancets lancets Use as instructed 2x a day 100 each 3   BD PEN NEEDLE NANO 2ND GEN 32G X 4 MM MISC USE AS DIRECTED TWICE DAILY 100 each 5   Continuous Blood Gluc Sensor (FREESTYLE LIBRE 2 SENSOR) MISC 1 each by Does not apply route every 14 (fourteen) days. 6 each 3   Dulaglutide (TRULICITY) 3 MG/0.5ML SOPN Inject 3 mg into the skin once a week. 6 mL 3   empagliflozin (JARDIANCE) 25 MG TABS tablet Take 1 tablet (25 mg total) by mouth daily. 90 tablet 3   glucose blood (ACCU-CHEK GUIDE) test strip Use as instructed 2x a day 100 each 3   insulin degludec (TRESIBA  FLEXTOUCH) 200 UNIT/ML FlexTouch Pen ADMINISTER 36 UNITS UNDER THE SKIN DAILY 12 mL 3   Insulin Pen Needle 31G X 6 MM MISC 1 Device by Does not apply route 2 (two) times daily. 60 each 3   metFORMIN (GLUCOPHAGE-XR) 500 MG 24 hr tablet Take 1 tablet 2x a day by mouth 180 tablet 3   NOVOLOG FLEXPEN 100 UNIT/ML FlexPen ADMINISTER 6-10 units before dinner under skin 15 mL 3   No current facility-administered medications on file prior to visit.   No Known Allergies   FH: - Maternal aunt with diabetes type 2, insulin-dependent - No known family history of  autoimmune diseases.  PE: BP 128/90 (BP Location: Left Arm, Patient Position: Sitting, Cuff Size: Normal)    Pulse (!) 106    Ht 6\' 1"  (1.854 m)    Wt 222 lb 3.2 oz (100.8 kg)    SpO2 96%    BMI 29.32 kg/m   Wt Readings from Last 3 Encounters:  05/19/21 222 lb 3.2 oz (100.8 kg)  01/12/21 214 lb 3.2 oz (97.2 kg)  03/13/20 230 lb 12.8 oz (104.7 kg)   Constitutional: overweight, in NAD Eyes: PERRLA, EOMI, no exophthalmos ENT: moist mucous membranes, no thyromegaly, no cervical lymphadenopathy Cardiovascular: Tachycardia, RR, No MRG Respiratory: CTA B Musculoskeletal: no deformities, strength intact in all 4 Skin: moist, warm, no rashes Neurological: no tremor with outstretched hands, DTR normal in all 4  ASSESSMENT: 1. DM2, insulin-dependent, with complications - Peripheral neuropathy  Component     Latest Ref Rng 05/29/2015  Glutamic Acid Decarb Ab     0.0 - 5.0 U/mL <5.0   Component     Latest Ref Rng 06/25/2015  C-Peptide     0.80-3.85 ng/mL 0.87  Glucose, Fasting     65 - 99 mg/dL 161161 (H)  Pancreatic Islet Cell Antibody     < 5 JDF Units <5  Vitamin B12     211 - 911 pg/mL 261  Anti-pancreatic antibodies negative. C-peptide low normal. He may have a degree of insulin deficiency, but this was not clearly evident...  Component     Latest Ref Rng & Units 02/05/2016  Hemoglobin A1C      5.3  C-Peptide     0.80 - 3.85  ng/mL 1.33  Glucose, Fasting     65 - 99 mg/dL 97   Labs again indicated good insulin production...  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 Low B12  PLAN:  1. Patient with uncontrolled insulin-dependent type 2 diabetes, returning at last visit after another long absence.  At that time, sugars were higher, even in the 300s, around the time when he had COVID.  They improved afterwards but they were not at goal and especially higher after dinner.  I advised him to add NovoLog before this meal and also recommended a CGM.  HbA1c was higher, at 8.0%. -Since last visit, he was able to start the CGM.  He feels that his sugars greatly improved after starting this.  He also has less stress at work and has more time off so he was able to work more on his diet. CGM interpretation: -At today's visit, we reviewed his CGM downloads: It appears that 96% of values are in target range (goal >70%), while 3% are higher than 180 (goal <25%), and 1% are lower than 70 (goal <4%).  The calculated average blood sugar is 125.  The projected HbA1c for the next 3 months (GMI) is 6.3%. -Reviewing the CGM trends, sugars appear to be excellent at all times of the day.  Discussed about parameters that allows us to evaluate his blood sugar control on the CGM report including the timing range, the glucose management indicator, and the glucose variability index.  His sugars do not appear to increase after meals.  They are slightly higher after breakfast, but still within the target range.  He does have an occasional hyperglycemic peak after dinner, but these are rare.  Overall, this is exemplary control.  Therefore, I advised him to try to stop NovoLog completely.  He is using it as needed for now.  Neck step would be to  start decreasing Tresiba dose. -At this visit, I recommended to switch from the freestyle libre 2 to a freestyle libre 3 CGM.  Discussed differences between the 2 generations.  He agrees with this change.  I sent the prescription to his  pharmacy. - I suggested to:  Patient Instructions  Please continue:  - Metformin ER 500 mg 2x a day with meals  - Jardiance 25 mg daily  - Trulicity 3 mg weekly  - Tresiba 36 units daily  Try to stop: - Novolog   Please return in 4 months with your sugar log.   - we checked his HbA1c: 6.3% (lower) - advised to check sugars at different times of the day - 4x a day, rotating check times - advised for yearly eye exams >> he is not UTD - needs a foot exam at next OV -he declines one today. - return to clinic in 4 months   2. Peripheral neuropathy  -His symptoms improved significantly after he started alpha-lipoic acid and B12 -He continues on the above supplements  3.  Low B12 vitamin -He has balance problems improved after starting supplementation with B12 -He continues on B12 1000 mcg daily -Latest B12 level was closer to the lower limit of normal at last check  Carlus Pavlov, MD PhD [facility]

## 2021-05-19 NOTE — Patient Instructions (Addendum)
Please continue:  - Metformin ER 500 mg 2x a day with meals  - Jardiance 25 mg daily  - Trulicity 3 mg weekly  - Tresiba 36 units daily  Try to stop: - Novolog   Please return in 4 months with your sugar log.

## 2021-09-10 ENCOUNTER — Encounter: Payer: Self-pay | Admitting: Internal Medicine

## 2021-09-10 DIAGNOSIS — E1165 Type 2 diabetes mellitus with hyperglycemia: Secondary | ICD-10-CM

## 2021-09-10 MED ORDER — TRESIBA FLEXTOUCH 200 UNIT/ML ~~LOC~~ SOPN: PEN_INJECTOR | SUBCUTANEOUS | 3 refills | Status: DC

## 2021-09-17 ENCOUNTER — Encounter: Payer: Self-pay | Admitting: Internal Medicine

## 2021-09-17 ENCOUNTER — Ambulatory Visit: Payer: BC Managed Care – PPO | Admitting: Internal Medicine

## 2021-09-17 VITALS — BP 130/92 | HR 108 | Ht 73.0 in | Wt 218.0 lb

## 2021-09-17 DIAGNOSIS — E538 Deficiency of other specified B group vitamins: Secondary | ICD-10-CM | POA: Diagnosis not present

## 2021-09-17 DIAGNOSIS — E1165 Type 2 diabetes mellitus with hyperglycemia: Secondary | ICD-10-CM | POA: Diagnosis not present

## 2021-09-17 DIAGNOSIS — E1142 Type 2 diabetes mellitus with diabetic polyneuropathy: Secondary | ICD-10-CM

## 2021-09-17 LAB — POCT GLYCOSYLATED HEMOGLOBIN (HGB A1C): Hemoglobin A1C: 6.5 % — AB (ref 4.0–5.6)

## 2021-09-17 NOTE — Progress Notes (Signed)
Patient ID: Greg Goodman, male   DOB: 29-May-1980, 41 y.o.   MRN: [id number]  HPI: Greg Goodman) is a 41 y.o.-year-old male, returning for follow-up for DM2, dx in 05/2015, insulin-dependent, uncontrolled, with complications (peripheral neuropathy). Last visit 4 mo ago.  Interim history: No increased urination, blurry vision, nausea, chest pain. 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  His DM control improved. He went in vacation to the beach approximately a month ago.  He is very tanned.  He is preparing to go to [place] next month.  Reviewed history: In 05/2014 >> started to feel poorly, with dizziness, lightheadedness, and chest pressure. Retrospectively, he also had increased thirst, increased urination, increased hunger with unintentional weight loss (40 lbs in 1 year!), blurry vision.  He went to UC >> CBG: "HI" >> sent to ED: 561. He was hospitalized and then discharged on insulin.  He was in the ED 2x after the initial admission >> weak, blurry vision - 06/02/2015 (Non-ketotic Hgly - 200-300). Then, he was in the ED (High Pt Reg) 06/17/2015 >> same sxs (Non-ketotic Hgly - 200s).   Since then, he remained on a basal/bolus insulin regimen.   He saw Jeanie Sewer for carb counting teaching.  He feels comfortable with this.   We checked him for type 1 diabetes 2x and the investigation was negative.  Reviewed HbA1c levels Lab Results  Component Value Date   HGBA1C 6.3 (A) 05/19/2021   HGBA1C 8.0 (A) 01/12/2021   HGBA1C 7.9 (A) 03/13/2020  08/03/2016: HbA1c calculated from the fructosamine is 7.17%.  He is on:  - Metformin ER 500 mg 2x a day with meals  - Jardiance 25 mg daily   - Trulicity 1.5 >> 3 weekly   - Tresiba 36 units daily - Occasionally Novolog 2-4 units for correction; added 6-10 units NovoLog before dinner >> now 4 units NovoLog as needed He tried regular Meformin >>  bloating, AP  Pt checks his sugars more than 4 times a day per review of his CGM downloads:   Previously:   Previously - am:  102-140, 183 >> 92-136 >> 83, 85, 147 >> 80, 326 - 2h after b'fast: 121-233 (sick) >>  118-163 >> 122 >> n/c >> 139, 216 - before lunch: 112-219 >> 65-140, 152 >> 114-143 >> 108-169 - 2h after lunch:   134, 184, 381 >> 205 >> 82, 158 >> 162, 190 - before dinner: 126-206 >> 120-164, 172 >> 130-179 >> 84-148 - 2h after dinner:  n/c >> 120 >> n/c >> 99, 171, 262 >> 129-268 - bedtime:  74-156, 169 >> n/c >> 117 >> 114-132 >> n/c  - nighttime: n/c160 >> 85 Lowest sugar was 92 >> 52 x1 (when had the defective meter) >> 84 >> (CGM) >> 53; hypoglycemia awareness in the 80s. Highest sugar was 381 >> ...326 >> 207 >> 300.  Glucometer: OneTouch Verio IQ >> AccuChek Guide  Pt's meals are: - Breakfast: 2 eggs + English muffin or toast or cereals (6 units) - Lunch: sandwich wrap or salad (4 units) - Dinner: chicken + salad + rice (6 units)  - Snacks: rice cakes He drinks diet sodas  No CKD, last BUN/creatinine:  Lab Results  Component Value Date   BUN 13 01/12/2021   CREATININE 0.98 01/12/2021   + HL: Last set of lipids: Lab Results  Component Value Date   CHOL 206 (H) 01/12/2021  HDL 49.30 01/12/2021   LDLCALC 136 (H) 01/12/2021   TRIG 103.0 01/12/2021   CHOLHDL 4 01/12/2021  05/08/2019: 190/111/40/130 He is not on a statin.  - last eye exam was 07/2019: No DR. He cannot schedule an appointment right now due to insurance.  -+ Only occasional slight numbness and tingling in his feet  Previously on Neurontin, then off His symptoms improved after starting on alpha lipoic acid and B12 vitamin.  In 04/2017, we started B12 1000 mcg daily.  Reviewed vitamin B12 levels: Lab Results  Component Value Date   VITAMINB12 246 01/12/2021   VITAMINB12 616 01/26/2019   VITAMINB12 946 (H) 12/02/2017   VITAMINB12 383 04/14/2017   VITAMINB12 261 06/25/2015   05/08/2019: Vitamin B12 415  Reviewed his TFTs: 05/08/2019: TSH 2.21 Lab Results  Component Value Date   TSH 1.55 01/26/2019   He had Covid19 in 09/2020. He had vertigo, dizziness, nausea, mm aches, HA, fever (103F).  Sugars were worse (300s).  He works as an Microbiologist.  ROS: + see HPI  I reviewed pt's medications, allergies, PMH, social hx, family hx, and changes were documented in the history of present illness. Otherwise, unchanged from my initial visit note.  Past Medical History:  Diagnosis Date   Anxiety    Depression    Diabetes mellitus without complication ([place]) [id number]   NEW ONSET   Past Surgical History:  Procedure Laterality Date   APPENDECTOMY     Social History   Social History   Marital Status: Single    Spouse Name: N/A   Number of Children: 0   Occupational History    Psychologist, prison and probation services    Social History Main Topics   Smoking status: Former Smoker -- 0.25 packs/day - quit 2017   Smokeless tobacco: Never Used   Alcohol Use: Yes     Comment: occ   Drug Use: No   Sexual Activity: Yes   Current Outpatient Medications on File Prior to Visit  Medication Sig Dispense Refill   Accu-Chek Softclix Lancets lancets Use as instructed 2x a day 100 each 3   BD PEN NEEDLE NANO 2ND GEN 32G X 4 MM MISC USE AS DIRECTED TWICE DAILY 100 each 5   Continuous Blood Gluc Sensor (FREESTYLE LIBRE 3 SENSOR) MISC 1 each by Does not apply route every 14 (fourteen) days. 6 each 3   Dulaglutide (TRULICITY) 3 0000000 SOPN Inject 3 mg into the skin once a week. 6 mL 3   empagliflozin (JARDIANCE) 25 MG TABS tablet Take 1 tablet (25 mg total) by mouth daily. 90 tablet 3   glucose blood (ACCU-CHEK GUIDE) test strip Use as instructed 2x a day 100 each 3   insulin degludec (TRESIBA FLEXTOUCH) 200 UNIT/ML FlexTouch Pen ADMINISTER 36 UNITS UNDER THE SKIN DAILY 12 mL 3   Insulin Pen Needle 31G X 6 MM MISC 1 Device by Does not apply route 2 (two) times daily. 60 each 3    metFORMIN (GLUCOPHAGE-XR) 500 MG 24 hr tablet Take 1 tablet 2x a day by mouth 180 tablet 3   NOVOLOG FLEXPEN 100 UNIT/ML FlexPen ADMINISTER 6-10 units before dinner under skin 15 mL 3   No current facility-administered medications on file prior to visit.   No Known Allergies   FH: - Maternal aunt with diabetes type 2, insulin-dependent - No known family history of autoimmune diseases.  PE: BP (!) 130/92 (BP Location: Left Arm, Patient Position: Sitting, Cuff Size: Normal)   Pulse (!) 108  Ht 6\' 1"  (1.854 m)   Wt 218 lb (98.9 kg)   SpO2 98%   BMI 28.76 kg/m   Wt Readings from Last 3 Encounters:  09/17/21 218 lb (98.9 kg)  05/19/21 222 lb 3.2 oz (100.8 kg)  01/12/21 214 lb 3.2 oz (97.2 kg)   Constitutional: overweight, in NAD Eyes: PERRLA, EOMI, no exophthalmos ENT: moist mucous membranes, no thyromegaly, no cervical lymphadenopathy Cardiovascular: Tachycardia, RR, No MRG Respiratory: CTA B Musculoskeletal: no deformities, strength intact in all 4 Skin: moist, warm, no rashes Neurological: no tremor with outstretched hands, DTR normal in all 4 Diabetic Foot Exam - Simple   Simple Foot Form Diabetic Foot exam was performed with the following findings: Yes 09/17/2021  3:46 PM  Visual Inspection No deformities, no ulcerations, no other skin breakdown bilaterally: Yes Sensation Testing Intact to touch and monofilament testing bilaterally: Yes Pulse Check Posterior Tibialis and Dorsalis pulse intact bilaterally: Yes Comments     ASSESSMENT: 1. DM2, insulin-dependent, with complications - Peripheral neuropathy  Component     Latest Ref Rng 05/29/2015  Glutamic Acid Decarb Ab     0.0 - 5.0 U/mL <5.0   Component     Latest Ref Rng 06/25/2015  C-Peptide     0.80-3.85 ng/mL 0.87  Glucose, Fasting     65 - 99 mg/dL 161 (H)  Pancreatic Islet Cell Antibody     < 5 JDF Units <5  Vitamin B12     211 - 911 pg/mL 261  Anti-pancreatic antibodies negative. C-peptide low  normal. He may have a degree of insulin deficiency, but this was not clearly evident...  Component     Latest Ref Rng & Units 02/05/2016  Hemoglobin A1C      5.3  C-Peptide     0.80 - 3.85 ng/mL 1.33  Glucose, Fasting     65 - 99 mg/dL 97   Labs again indicated good insulin production...  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 Low B12  PLAN:  1. Patient with history of uncontrolled insulin-dependent type 2 diabetes, with improved blood sugar control after decreasing stress at work and starting a CGM.  At last visit HbA1c was better, at 6.3%.  At that time, reviewing his CGM traces, sugars were excellent at all times of the day.  We discussed about possibly stopping NovoLog.  He is currently only occasionally using it between 4 to 6 units. CGM interpretation: -At today's visit, we reviewed his CGM downloads: It appears that 80% of values are in target range (goal >70%), while 19% are higher than 180 (goal <25%), and 1% are lower than 70 (goal <4%).  The calculated average blood sugar is 147.  The projected HbA1c for the next 3 months (GMI) is 6.8%. -Reviewing the CGM trends, it appears that his sugars are very well controlled throughout the day, but they do increase after dinner and they stay in the upper half of the target range and slightly above overnight.  We discussed that this is likely related to his dinner.  I advised him about doing different experiments with dinner including changing the order of the food to have the carbs last and the protein and fat first.  We also discussed about having alcohol after, rather than before the meal.  Other than this, however, I did not advise him to change his regimen.  At next visit, if the highs overnight persist, we can increase the [place] dose or use NovoLog before dinner. - I suggested to:  Patient  Instructions  Please continue:  - Metformin ER 500 mg 2x a day with meals  - Jardiance 25 mg daily  - Trulicity 3 mg weekly  - Tresiba 36 units daily  Try to look  at dinner and see if you can change this.  Please return in 4 months.  - we checked his HbA1c: 6.5% (slightly higher) - advised to check sugars at different times of the day - 4x a day, rotating check times - advised for yearly eye exams >> he is not UTD - return to clinic in 4 months   2. Peripheral neuropathy  -His symptoms improved significantly after starting B12 supplementation and also alpha lipoid acid -now only very rarely gets numbness and tingling -He continues on the above supplements  3.  Low B12 vitamin -He has balance problems improved after starting supplementation with B12 -He continues on B12 1000 mcg daily -Latest B12 level was normal at last check in 01/2021, but close to the lower limit of normal -We will recheck this at next visit  Philemon Kingdom, MD PhD [facility] Endocrinology

## 2021-09-17 NOTE — Patient Instructions (Addendum)
Please continue:  - Metformin ER 500 mg 2x a day with meals  - Jardiance 25 mg daily  - Trulicity 3 mg weekly  - Tresiba 36 units daily  Try to look at dinner and see if you can change this.  Please return in 4 months.

## 2021-10-15 ENCOUNTER — Encounter: Payer: Self-pay | Admitting: Internal Medicine

## 2021-10-15 DIAGNOSIS — E1142 Type 2 diabetes mellitus with diabetic polyneuropathy: Secondary | ICD-10-CM

## 2021-10-16 MED ORDER — NOVOLOG FLEXPEN 100 UNIT/ML ~~LOC~~ SOPN
PEN_INJECTOR | SUBCUTANEOUS | 3 refills | Status: DC
Start: 2021-10-16 — End: 2022-05-18

## 2022-01-21 ENCOUNTER — Ambulatory Visit: Payer: BC Managed Care – PPO | Admitting: Internal Medicine

## 2022-01-25 ENCOUNTER — Ambulatory Visit: Payer: BC Managed Care – PPO | Admitting: Internal Medicine

## 2022-01-25 ENCOUNTER — Encounter: Payer: Self-pay | Admitting: Internal Medicine

## 2022-01-25 VITALS — BP 120/74 | HR 96 | Ht 73.0 in | Wt 208.8 lb

## 2022-01-25 DIAGNOSIS — E1142 Type 2 diabetes mellitus with diabetic polyneuropathy: Secondary | ICD-10-CM | POA: Diagnosis not present

## 2022-01-25 DIAGNOSIS — E1165 Type 2 diabetes mellitus with hyperglycemia: Secondary | ICD-10-CM | POA: Diagnosis not present

## 2022-01-25 DIAGNOSIS — E538 Deficiency of other specified B group vitamins: Secondary | ICD-10-CM

## 2022-01-25 LAB — POCT GLYCOSYLATED HEMOGLOBIN (HGB A1C): Hemoglobin A1C: 6.2 % — AB (ref 4.0–5.6)

## 2022-01-25 NOTE — Progress Notes (Unsigned)
Patient ID: Greg Goodman, male   DOB: [birth year], 41 y.o.   MRN: [id number]  HPI: Greg Goodman) is a 41 y.o.-year-old male, returning for follow-up for DM2, dx in 05/2015, insulin-dependent, uncontrolled, with complications (peripheral neuropathy). Last visit 4 mo ago.  Interim history: No increased urination, blurry vision, nausea, chest pain. 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  His DM control improved. She went to [place] since last visit.  Reviewed history: In 05/2014 >> started to feel poorly, with dizziness, lightheadedness, and chest pressure. Retrospectively, he also had increased thirst, increased urination, increased hunger with unintentional weight loss (40 lbs in 1 year!), blurry vision.  He went to UC >> CBG: "HI" >> sent to ED: 561. He was hospitalized and then discharged on insulin.  He was in the ED 2x after the initial admission >> weak, blurry vision - 06/02/2015 (Non-ketotic Hgly - 200-300). Then, he was in the ED (High Pt Reg) 06/17/2015 >> same sxs (Non-ketotic Hgly - 200s).   Since then, he remained on a basal/bolus insulin regimen.   He saw Pincus Large for carb counting teaching.  He feels comfortable with this.   We checked him for type 1 diabetes 2x and the investigation was negative.  Reviewed HbA1c levels Lab Results  Component Value Date   HGBA1C 6.5 (A) 09/17/2021   HGBA1C 6.3 (A) 05/19/2021   HGBA1C 8.0 (A) 01/12/2021  08/03/2016: HbA1c calculated from the fructosamine is 7.17%.  He is on:  - Metformin ER 500 mg 2x a day with meals  - Jardiance 25 mg daily   - Trulicity 1.5 >> 3 weekly   - Tresiba 36 units daily - Occasionally Novolog 2-4 units for correction; added 6-10 units NovoLog before dinner >> now 4 units NovoLog as needed He tried regular Meformin >> bloating, AP  Pt checks his sugars more than 4 times a day per review of his CGM  downloads:  Previously:   Previously:   Lowest sugar was 92 >> 52 x1 (when had the defective meter) >> 84 >> (CGM) >> 53 >> 59; hypoglycemia awareness in the 80s. Highest sugar was 381 >> ...326 >> 207 >> 300 >> 300s.  Glucometer: OneTouch Verio IQ >> AccuChek Guide  Pt's meals are: - Breakfast: 2 eggs + English muffin or toast or cereals (6 units) - Lunch: sandwich wrap or salad (4 units) - Dinner: chicken + salad + rice (6 units)  - Snacks: rice cakes He drinks diet sodas  No CKD, last BUN/creatinine:  Lab Results  Component Value Date   BUN 13 01/12/2021   CREATININE 0.98 01/12/2021   + HL: Last set of lipids: Lab Results  Component Value Date   CHOL 206 (H) 01/12/2021   HDL 49.30 01/12/2021   LDLCALC 136 (H) 01/12/2021   TRIG 103.0 01/12/2021   CHOLHDL 4 01/12/2021  05/08/2019: 190/111/40/130 He is not on a statin.  - last eye exam was 07/2019: No DR. He cannot schedule an appointment right now due to insurance. Now coming up.  -+ Only occasional slight numbness and tingling in his feet.  Last foot exam 09/17/2021.  Previously on Neurontin, then off His symptoms improved after starting on alpha lipoic acid and B12 vitamin.  In 04/2017, we started B12 1000 mcg daily.  Reviewed vitamin B12 levels: Lab Results  Component Value Date   VITAMINB12 246 01/12/2021   VITAMINB12 616 01/26/2019  VITAMINB12 946 (H) 12/02/2017   VITAMINB12 383 04/14/2017   VITAMINB12 261 06/25/2015  05/08/2019: Vitamin B12 415  Reviewed his TFTs: 05/08/2019: TSH 2.21 Lab Results  Component Value Date   TSH 1.55 01/26/2019   He had Covid19 in 09/2020. He had vertigo, dizziness, nausea, mm aches, HA, fever (103F).  Sugars were worse (300s).  He works as an Tax inspector.  ROS: + see HPI  I reviewed pt's medications, allergies, PMH, social hx, family hx, and changes were documented in the history of present illness. Otherwise, unchanged from my initial visit note.  Past  Medical History:  Diagnosis Date   Anxiety    Depression    Diabetes mellitus without complication ([facility]) 05/2015   NEW ONSET   Past Surgical History:  Procedure Laterality Date   APPENDECTOMY     Social History   Social History   Marital Status: Single    Spouse Name: N/A   Number of Children: 0   Occupational History    Retail buyer    Social History Main Topics   Smoking status: Former Smoker -- 0.25 packs/day - quit 2017   Smokeless tobacco: Never Used   Alcohol Use: Yes     Comment: occ   Drug Use: No   Sexual Activity: Yes   Current Outpatient Medications on File Prior to Visit  Medication Sig Dispense Refill   Accu-Chek Softclix Lancets lancets Use as instructed 2x a day 100 each 3   BD PEN NEEDLE NANO 2ND GEN 32G X 4 MM MISC USE AS DIRECTED TWICE DAILY 100 each 5   Continuous Blood Gluc Sensor (FREESTYLE LIBRE 3 SENSOR) MISC 1 each by Does not apply route every 14 (fourteen) days. 6 each 3   Dulaglutide (TRULICITY) 3 MG/0.5ML SOPN Inject 3 mg into the skin once a week. 6 mL 3   empagliflozin (JARDIANCE) 25 MG TABS tablet Take 1 tablet (25 mg total) by mouth daily. 90 tablet 3   glucose blood (ACCU-CHEK GUIDE) test strip Use as instructed 2x a day 100 each 3   insulin degludec (TRESIBA FLEXTOUCH) 200 UNIT/ML FlexTouch Pen ADMINISTER 36 UNITS UNDER THE SKIN DAILY 12 mL 3   Insulin Pen Needle 31G X 6 MM MISC 1 Device by Does not apply route 2 (two) times daily. 60 each 3   metFORMIN (GLUCOPHAGE-XR) 500 MG 24 hr tablet Take 1 tablet 2x a day by mouth 180 tablet 3   NOVOLOG FLEXPEN 100 UNIT/ML FlexPen ADMINISTER 6-10 units before dinner under skin 15 mL 3   No current facility-administered medications on file prior to visit.   No Known Allergies   FH: - Maternal aunt with diabetes type 2, insulin-dependent - No known family history of autoimmune diseases.  PE: BP 120/74 (BP Location: Right Arm, Patient Position: Sitting, Cuff Size: Normal)   Pulse 96   Ht 6'  1" (1.854 m)   Wt 208 lb 12.8 oz (94.7 kg)   SpO2 99%   BMI 27.55 kg/m   Wt Readings from Last 3 Encounters:  01/25/22 208 lb 12.8 oz (94.7 kg)  09/17/21 218 lb (98.9 kg)  05/19/21 222 lb 3.2 oz (100.8 kg)   Constitutional: overweight, in NAD Eyes:  EOMI, no exophthalmos ENT: no neck masses, no cervical lymphadenopathy Cardiovascular: RRR, No MRG Respiratory: CTA B Musculoskeletal: no deformities Skin:no rashes Neurological: no tremor with outstretched hands  ASSESSMENT: 1. DM2, insulin-dependent, with complications - Peripheral neuropathy  Component     Latest Ref Rng 05/29/2015  Glutamic Acid Decarb Ab     0.0 - 5.0 U/mL <5.0   Component     Latest Ref Rng 06/25/2015  C-Peptide     0.80-3.85 ng/mL 0.87  Glucose, Fasting     65 - 99 mg/dL 161 (H)  Pancreatic Islet Cell Antibody     < 5 JDF Units <5  Vitamin B12     211 - 911 pg/mL 261  Anti-pancreatic antibodies negative. C-peptide low normal. He may have a degree of insulin deficiency, but this was not clearly evident...  Component     Latest Ref Rng & Units 02/05/2016  Hemoglobin A1C      5.3  C-Peptide     0.80 - 3.85 ng/mL 1.33  Glucose, Fasting     65 - 99 mg/dL 97   Labs again indicated good insulin production...  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 Low B12  PLAN:  1. Patient with history of uncontrolled insulin-dependent type 2 diabetes, with improved blood sugars after decreasing stress at work and starting a CGM.  HbA1c at last visit was 6.5%, slightly higher than before.  Sugars are very well controlled throughout the day but they were increasing after dinner and they were staying in the upper half of the target range or slightly above overnight.  We discussed that this was related to his dinner.  I advised him to do different experiments with dinner including changing the order of the food to have the carbs last in the protein and fat first.  We also discussed about having alcohol after, rather than before the meal.   Otherwise, I did not advise to change his regimen.  I did not advise him to increase NovoLog before dinner at that time. CGM interpretation: -At today's visit, we reviewed his CGM downloads: It appears that 79% of values are in target range (goal >70%), while 20% are higher than 180 (goal <25%), and 1% are lower than 70 (goal <4%).  The calculated average blood sugar is 161.  The projected HbA1c for the next 3 months (GMI) is 6.9%. -Reviewing the CGM trends, sugars are fluctuating in the normal range, with very few exceptions above and 1 less below the target.  He is taking occasional NovoLog before dinner, and we discussed that he might need a slightly higher dose with a larger meal, but otherwise, I feel that he is regimen is adequate.  In the last 2 weeks, the projected HbA1c (GMI) is 6.9%, which is higher than the HbA1c obtained today, but this is likely due to stress [place] a[place] and one ending up dying. - I suggested to:  Patient Instructions  Please continue:  - Metformin ER 1000 mg at night  - Jardiance 25 mg daily  - Trulicity 3 mg weekly  - Tresiba 36 units daily  Use: - NovoLog 0-5 units before dinner  Please return in 4 months.  - we checked his HbA1c: 6.2% (lower) - advised to check sugars at different times of the day - 4x a day, rotating check times - advised for yearly eye exams >> he is UTD - he is due for annual labs - return to clinic in 4 months   2. Peripheral neuropathy  -His symptoms improved significantly after starting B12 supplementation and also alpha-lipoic acid-now only rarely gets numbness and tingling -He continues on the above supplements  3.  Low B12 vitamin -He has balance problems improved after starting supplementation with B12 -He is on  B12 1000 mcg daily -Latest B12 level was normal at last check in 01/2021, but close to the lower limit of normal -We will recheck this today  Orders Placed This Encounter   Procedures   Comprehensive metabolic panel   Lipid panel   Microalbumin / creatinine urine ratio   Vitamin B12   POCT glycosylated hemoglobin (Hb A1C)   Carlus Pavlov, MD PhD [facility] Endocrinology

## 2022-01-25 NOTE — Patient Instructions (Addendum)
Please continue:  - Metformin ER 1000 mg at night  - Jardiance 25 mg daily  - Trulicity 3 mg weekly  - Tresiba 36 units daily  Use: - NovoLog 0-5 units before dinner  Please return in 4 months.

## 2022-01-26 LAB — COMPREHENSIVE METABOLIC PANEL
ALT: 55 U/L — ABNORMAL HIGH (ref 0–53)
AST: 35 U/L (ref 0–37)
Albumin: 5.1 g/dL (ref 3.5–5.2)
Alkaline Phosphatase: 70 U/L (ref 39–117)
BUN: 17 mg/dL (ref 6–23)
CO2: 29 mEq/L (ref 19–32)
Calcium: 10.2 mg/dL (ref 8.4–10.5)
Chloride: 101 mEq/L (ref 96–112)
Creatinine, Ser: 0.92 mg/dL (ref 0.40–1.50)
GFR: 104.52 mL/min (ref >60.00)
Glucose, Bld: 77 mg/dL (ref 70–99)
Potassium: 4.2 mEq/L (ref 3.5–5.1)
Sodium: 139 mEq/L (ref 135–145)
Total Bilirubin: 0.6 mg/dL (ref 0.2–1.2)
Total Protein: 8.2 g/dL (ref 6.0–8.3)

## 2022-01-26 LAB — LIPID PANEL
Cholesterol: 195 mg/dL (ref 0–200)
HDL: 51.5 mg/dL (ref >39.00)
LDL Cholesterol: 115 mg/dL — ABNORMAL HIGH (ref 0–99)
NonHDL: 143.38
Total CHOL/HDL Ratio: 4
Triglycerides: 141 mg/dL (ref 0.0–149.0)
VLDL: 28.2 mg/dL (ref 0.0–40.0)

## 2022-01-26 LAB — MICROALBUMIN / CREATININE URINE RATIO
Creatinine,U: 91.3 mg/dL
Microalb Creat Ratio: 1.4 mg/g (ref 0.0–30.0)
Microalb, Ur: 1.3 mg/dL (ref 0.0–1.9)

## 2022-01-26 LAB — VITAMIN B12: Vitamin B-12: 232 pg/mL (ref 211–911)

## 2022-02-20 ENCOUNTER — Other Ambulatory Visit: Payer: Self-pay | Admitting: Internal Medicine

## 2022-04-27 ENCOUNTER — Encounter: Payer: Self-pay | Admitting: Internal Medicine

## 2022-04-27 DIAGNOSIS — E1142 Type 2 diabetes mellitus with diabetic polyneuropathy: Secondary | ICD-10-CM

## 2022-04-27 MED ORDER — EMPAGLIFLOZIN 25 MG PO TABS: 25.0000 mg | ORAL_TABLET | Freq: Every day | ORAL | 1 refills | Status: DC

## 2022-05-06 ENCOUNTER — Encounter: Payer: Self-pay | Admitting: Internal Medicine

## 2022-05-06 DIAGNOSIS — E1165 Type 2 diabetes mellitus with hyperglycemia: Secondary | ICD-10-CM

## 2022-05-06 MED ORDER — FREESTYLE LIBRE 3 SENSOR MISC: 1.0000 | 1 refills | Status: DC

## 2022-05-18 ENCOUNTER — Encounter: Payer: Self-pay | Admitting: Internal Medicine

## 2022-05-18 DIAGNOSIS — E1142 Type 2 diabetes mellitus with diabetic polyneuropathy: Secondary | ICD-10-CM

## 2022-05-18 MED ORDER — TRESIBA FLEXTOUCH 200 UNIT/ML ~~LOC~~ SOPN: PEN_INJECTOR | SUBCUTANEOUS | 1 refills | Status: DC

## 2022-05-18 MED ORDER — NOVOLOG FLEXPEN 100 UNIT/ML ~~LOC~~ SOPN: PEN_INJECTOR | SUBCUTANEOUS | 1 refills | Status: DC

## 2022-05-31 ENCOUNTER — Encounter: Payer: Self-pay | Admitting: Internal Medicine

## 2022-05-31 DIAGNOSIS — E1142 Type 2 diabetes mellitus with diabetic polyneuropathy: Secondary | ICD-10-CM

## 2022-05-31 MED ORDER — TRULICITY 3 MG/0.5ML ~~LOC~~ SOAJ: 3.0000 mg | SUBCUTANEOUS | 3 refills | Status: DC

## 2022-06-07 ENCOUNTER — Ambulatory Visit (INDEPENDENT_AMBULATORY_CARE_PROVIDER_SITE_OTHER): Payer: BC Managed Care – PPO | Admitting: Internal Medicine

## 2022-06-07 ENCOUNTER — Encounter: Payer: Self-pay | Admitting: Internal Medicine

## 2022-06-07 VITALS — BP 128/84 | HR 104 | Ht 73.0 in | Wt 207.0 lb

## 2022-06-07 DIAGNOSIS — E538 Deficiency of other specified B group vitamins: Secondary | ICD-10-CM | POA: Diagnosis not present

## 2022-06-07 DIAGNOSIS — E1165 Type 2 diabetes mellitus with hyperglycemia: Secondary | ICD-10-CM | POA: Diagnosis not present

## 2022-06-07 DIAGNOSIS — E1142 Type 2 diabetes mellitus with diabetic polyneuropathy: Secondary | ICD-10-CM

## 2022-06-07 LAB — POCT GLYCOSYLATED HEMOGLOBIN (HGB A1C): Hemoglobin A1C: 6.4 % — AB (ref 4.0–5.6)

## 2022-06-07 NOTE — Progress Notes (Signed)
Patient ID: Greg Goodman, male   DOB: 15-Jul-1980, 42 y.o.   MRN: [id number]  HPI: Greg Goodman) is a 42 y.o.-year-old male, returning for follow-up for DM2, dx in 05/2015, insulin-dependent, uncontrolled, with complications (peripheral neuropathy). Last visit 4.5 mo ago.  Interim history: No increased urination, blurry vision, nausea, chest pain. He started to exercise more consistently (cardio) after the first of the year and noticed that his sugars were dropping during exercise. He had 1 instance when his sugars dropped to 55 during exercise (did not hear the sensor alarm).  Since then, he contacted his sensor  to his watch which also vibrates when trending low. He was recently dx'ed with ADHD and will start medication for this.  Reviewed history: In 05/2014 >> started to feel poorly, with dizziness, lightheadedness, and chest pressure. Retrospectively, he also had increased thirst, increased urination, increased hunger with unintentional weight loss (40 lbs in 1 year!), blurry vision.  He went to UC >> CBG: "HI" >> sent to ED: 561. He was hospitalized and then discharged on insulin.  He was in the ED 2x after the initial admission >> weak, blurry vision - 06/02/2015 (Non-ketotic Hgly - 200-300). Then, he was in the ED (High Pt Reg) 06/17/2015 >> same sxs (Non-ketotic Hgly - 200s).   Since then, he remained on a basal/bolus insulin regimen.   He saw Greg Goodman for carb counting teaching.  He feels comfortable with this.   We checked him for type 1 diabetes 2x and the investigation was negative.  Reviewed HbA1c levels Lab Results  Component Value Date   HGBA1C 6.2 (A) 01/25/2022   HGBA1C 6.5 (A) 09/17/2021   HGBA1C 6.3 (A) 05/19/2021  08/03/2016: HbA1c calculated from the fructosamine is 7.17%.  He is on:  - Metformin ER 500 mg 2x a day with meals >> 1000 mg at dinner  - Jardiance 25 mg daily   - Trulicity 1.5 >> 3 weekly   - Tresiba 36 units daily - Occasionally  Novolog 2-4 units for correction; added 6-10 units NovoLog before dinner >> 4 units NovoLog prn >> 0-5 units before dinner usually, occasionally before the other meals He tried regular Meformin >> bloating, AP  Pt checks his sugars more than 4 times a day per review of his CGM downloads:  Previously:  Previously:   Lowest sugar was 92 >> 52 x1 (when had the defective meter) >> 84 >> (CGM) >> 53 >> 59 >> 55 (exercise); hypoglycemia awareness in the 80s. Highest sugar was 381 >> .Marland KitchenMarland Kitchen 300s >> 200s.  Glucometer: OneTouch Verio IQ >> AccuChek Guide  Pt's meals are: - Breakfast: 2 eggs + English muffin or toast or cereals (6 units) - Lunch: sandwich wrap or salad (4 units) - Dinner: chicken + salad + rice (6 units)  - Snacks: rice cakes He drinks diet sodas  No CKD, last BUN/creatinine:  Lab Results  Component Value Date   BUN 17 01/25/2022   CREATININE 0.92 01/25/2022   + HL: Last set of lipids: Lab Results  Component Value Date   CHOL 195 01/25/2022   HDL 51.50 01/25/2022   LDLCALC 115 (H) 01/25/2022   TRIG 141.0 01/25/2022   CHOLHDL 4 01/25/2022  05/08/2019: 190/111/40/130 He is not on a statin.  - last eye exam was 04/2022: No DR reportedly.  Has astigmatism.  -+ Only occasional slight numbness and tingling in his feet.  Last foot exam 09/17/2021.  Previously on Neurontin, then off His symptoms improved after starting on  alpha lipoic acid and B12 vitamin.  In 04/2017, we started B12 1000 mcg daily.  Reviewed vitamin B12 levels: Lab Results  Component Value Date   VITAMINB12 232 01/25/2022   VITAMINB12 246 01/12/2021   VITAMINB12 616 01/26/2019   VITAMINB12 946 (H) 12/02/2017   VITAMINB12 383 04/14/2017   VITAMINB12 261 06/25/2015  05/08/2019: Vitamin B12 415  Reviewed his TFTs: 05/08/2019: TSH 2.21 Lab Results  Component Value Date   TSH 1.55 01/26/2019   He had Covid19 in 09/2020. He had vertigo, dizziness, nausea, mm aches, HA, fever (103F).  Sugars were  worse (300s).  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  His DM control improved.  ROS: + see HPI  I reviewed pt's medications, allergies, PMH, social hx, family hx, and changes were documented in the history of present illness. Otherwise, unchanged from my initial visit note.  Past Medical History:  Diagnosis Date   Anxiety    Depression    Diabetes mellitus without complication ([place]) [id number]   NEW ONSET   Past Goodman History:  Procedure Laterality Date   APPENDECTOMY     Social History   Social History   Marital Status: Single    Spouse Name: N/A   Number of Children: 0   Occupational History    Psychologist, prison and probation services    Social History Main Topics   Smoking status: Former Smoker -- 0.25 packs/day - quit 2017   Smokeless tobacco: Never Used   Alcohol Use: Yes     Comment: occ   Drug Use: No   Sexual Activity: Yes   Current Outpatient Medications on File Prior to Visit  Medication Sig Dispense Refill   Accu-Chek Softclix Lancets lancets Use as instructed 2x a day 100 each 3   BD PEN NEEDLE NANO 2ND GEN 32G X 4 MM MISC USE AS DIRECTED TWICE DAILY 100 each 5   Continuous Blood Gluc Sensor (FREESTYLE LIBRE 3 SENSOR) MISC 1 each by Does not apply route every 14 (fourteen) days. 6 each 1   Dulaglutide (TRULICITY) 3 0000000 SOPN Inject 3 mg into the skin once a week. 6 mL 3   empagliflozin (JARDIANCE) 25 MG TABS tablet Take 1 tablet (25 mg total) by mouth daily. 90 tablet 1   glucose blood (ACCU-CHEK GUIDE) test strip Use as instructed 2x a day 100 each 3   insulin degludec (TRESIBA FLEXTOUCH) 200 UNIT/ML FlexTouch Pen ADMINISTER 36 UNITS UNDER THE SKIN DAILY 12 mL 1   Insulin Pen Needle 31G X 6 MM MISC 1 Device by Does not apply route 2 (two) times daily. 60 each 3   metFORMIN (GLUCOPHAGE-XR) 500 MG 24 hr tablet TAKE 1 TABLET BY MOUTH TWICE DAILY 180 tablet 1   NOVOLOG FLEXPEN 100 UNIT/ML  FlexPen ADMINISTER 6-10 units before dinner under skin 15 mL 1   No current facility-administered medications on file prior to visit.   No Known Allergies   FH: - Maternal aunt with diabetes type 2, insulin-dependent - No known family history of autoimmune diseases.  PE: BP 128/84 (BP Location: Left Arm, Patient Position: Sitting, Cuff Size: Normal)   Pulse (!) 104   Ht '6\' 1"'$  (1.854 m)   Wt 207 lb (93.9 kg)   SpO2 98%   BMI 27.31 kg/m   Wt Readings from Last 3 Encounters:  06/07/22 207 lb (93.9 kg)  01/25/22 208 lb 12.8 oz (94.7 kg)  09/17/21 218  lb (98.9 kg)   Constitutional: overweight, in NAD Eyes:  EOMI, no exophthalmos ENT: no neck masses, no cervical lymphadenopathy Cardiovascular: tachycardia, RR, No MRG Respiratory: CTA B Musculoskeletal: no deformities Skin:no rashes Neurological: no tremor with outstretched hands  ASSESSMENT: 1. DM2, insulin-dependent, with complications - Peripheral neuropathy  Component     Latest Ref Rng 05/29/2015  Glutamic Acid Decarb Ab     0.0 - 5.0 U/mL <5.0   Component     Latest Ref Rng 06/25/2015  C-Peptide     0.80-3.85 ng/mL 0.87  Glucose, Fasting     65 - 99 mg/dL 161 (H)  Pancreatic Islet Cell Antibody     < 5 JDF Units <5  Vitamin B12     211 - 911 pg/mL 261  Anti-pancreatic antibodies negative. C-peptide low normal. He may have a degree of insulin deficiency, but this was not clearly evident...  Component     Latest Ref Rng & Units 02/05/2016  Hemoglobin A1C      5.3  C-Peptide     0.80 - 3.85 ng/mL 1.33  Glucose, Fasting     65 - 99 mg/dL 97  Labs again indicated good insulin production...  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 Low B12  PLAN:  1. Patient with history of uncontrolled insulin-dependent type 2 diabetes with improved blood sugars after decreasing stress at work and starting a CGM.  HbA1c at last visit was 6.2%, improved.  Reviewing his CGM trends, they were fluctuating in the normal range with very few exceptions  above target.  He was taking occasional NovoLog before dinner and we discussed that he may need a slightly higher dose with a larger meal, but otherwise, I did not recommend a change in his CGM. CGM interpretation: -At today's visit, we reviewed his CGM downloads: It appears that 76% of values are in target range (goal >70%), while 24% are higher than 180 (goal <25%), and 0% are lower than 70 (goal <4%).  The calculated average blood sugar is 151.  The projected HbA1c for the next 3 months (GMI) is 6.9%. -Reviewing the CGM trends, sugars are fluctuating higher in the target range, with spikes above 180 after every meal and also during the night, at approximately 4 AM.  He feels that the increase may be related to the fact that he did not exercise in the last 2 weeks but plans to restart.  He feels that his high blood sugars during the night is due to waking up with anxiety about the upcoming day.  He is hoping that ADHD treatment may help with this.   - as of now, since he is planning to restart exercise and possibly also ADHD medication, I did not suggest changes in his regimen.  Discussed that after these measures, if the sugars remain elevated, we may need to increase the Trulicity dose further. -Before exercise, he is eating a carbohydrate snack : a banana or apple, and after exercise, he is his regular breakfast including eggs.  He usually takes now NovoLog in the morning, which appears to be appropriate.  However, I did advise him that he may need to add some protein to a snack before exercise.  I ended adding some protein before exercise will present hypoglycemia immediately after exercise.  Given examples. - I suggested to:  Patient Instructions  Please continue:  - Metformin ER 1000 mg at night  - Jardiance 25 mg daily  - Trulicity 3 mg weekly (let me know if we  need the 4.5 mg dose)  - Tresiba 36 units daily - NovoLog 0-5 units before dinner  Please return in 4 months.  - we checked his  HbA1c: 6.2% (lower) - advised to check sugars at different times of the day - 4x a day, rotating check times - advised for yearly eye exams >> he is not UTD - return to clinic in 4 months   2. Peripheral neuropathy  -His symptoms improved significantly after starting B12 supplementation and also alpha-lipoic acid-now only rarely gets numbness and tingling -He continues on the supplements  3.  Low B12 vitamin -He has balance problems improved after starting supplementation with B12 -He is on B12 1000 mcg daily -Latest B12 level was normal, but at the lower end of the target range, at [place], MD PhD [facility] Endocrinology

## 2022-06-07 NOTE — Patient Instructions (Addendum)
Please continue:  - Metformin ER 1000 mg at night  - Jardiance 25 mg daily  - Trulicity 3 mg weekly (let me know if we need the 4.5 mg dose)  - Tresiba 36 units daily - NovoLog 0-5 units before dinner  Please return in 4 months.

## 2022-06-11 ENCOUNTER — Other Ambulatory Visit: Payer: Self-pay | Admitting: Internal Medicine

## 2022-09-09 ENCOUNTER — Other Ambulatory Visit: Payer: Self-pay | Admitting: Internal Medicine

## 2022-09-09 DIAGNOSIS — E1165 Type 2 diabetes mellitus with hyperglycemia: Secondary | ICD-10-CM

## 2022-09-20 ENCOUNTER — Other Ambulatory Visit: Payer: Self-pay | Admitting: Internal Medicine

## 2022-09-20 ENCOUNTER — Encounter: Payer: Self-pay | Admitting: Internal Medicine

## 2022-09-20 DIAGNOSIS — E1142 Type 2 diabetes mellitus with diabetic polyneuropathy: Secondary | ICD-10-CM

## 2022-10-12 ENCOUNTER — Ambulatory Visit: Payer: BC Managed Care – PPO | Admitting: Internal Medicine

## 2022-10-15 ENCOUNTER — Ambulatory Visit: Payer: BC Managed Care – PPO | Admitting: Internal Medicine

## 2022-10-15 ENCOUNTER — Encounter: Payer: Self-pay | Admitting: Internal Medicine

## 2022-10-15 VITALS — BP 138/80 | HR 99 | Ht 73.0 in | Wt 193.4 lb

## 2022-10-15 DIAGNOSIS — E1142 Type 2 diabetes mellitus with diabetic polyneuropathy: Secondary | ICD-10-CM

## 2022-10-15 DIAGNOSIS — Z7985 Long-term (current) use of injectable non-insulin antidiabetic drugs: Secondary | ICD-10-CM

## 2022-10-15 DIAGNOSIS — Z7984 Long term (current) use of oral hypoglycemic drugs: Secondary | ICD-10-CM

## 2022-10-15 DIAGNOSIS — E119 Type 2 diabetes mellitus without complications: Secondary | ICD-10-CM

## 2022-10-15 DIAGNOSIS — E1165 Type 2 diabetes mellitus with hyperglycemia: Secondary | ICD-10-CM

## 2022-10-15 DIAGNOSIS — E538 Deficiency of other specified B group vitamins: Secondary | ICD-10-CM

## 2022-10-15 DIAGNOSIS — Z794 Long term (current) use of insulin: Secondary | ICD-10-CM

## 2022-10-15 LAB — HEMOGLOBIN A1C: Hemoglobin A1C: 5.6

## 2022-10-15 MED ORDER — TRULICITY 3 MG/0.5ML ~~LOC~~ SOAJ: 3.0000 mg | SUBCUTANEOUS | 3 refills | Status: DC

## 2022-10-15 MED ORDER — OZEMPIC (0.25 OR 0.5 MG/DOSE) 2 MG/1.5ML ~~LOC~~ SOPN: 0.5000 mg | PEN_INJECTOR | SUBCUTANEOUS | 3 refills | Status: DC

## 2022-10-15 NOTE — Progress Notes (Signed)
Patient ID: Greg Goodman, male   DOB: 1980-11-13, 42 y.o.   MRN: [id number]  HPI: Greg Goodman [facility]) is a 42 y.o.-year-old male, returning for follow-up for DM2, dx in 05/2015, insulin-dependent, uncontrolled, with complications (peripheral neuropathy). Last visit 4 mo ago.  Interim history: No increased urination, blurry vision, nausea, chest pain. At last visit, he was able to start generic Adderall and he feels much better, he is able to exercise consistently, to sleep well, and also his anxiety and fatigue has resolved.  He is also working on improving his diet.  He lost 10 pounds since last visit!  Reviewed history: In 05/2014 >> started to feel poorly, with dizziness, lightheadedness, and chest pressure. Retrospectively, he also had increased thirst, increased urination, increased hunger with unintentional weight loss (40 lbs in 1 year!), blurry vision.  He went to UC >> CBG: "HI" >> sent to ED: 561. He was hospitalized and then discharged on insulin.  He was in the ED 2x after the initial admission >> weak, blurry vision - 06/02/2015 (Non-ketotic Hgly - 200-300). Then, he was in the ED (High Pt Reg) 06/17/2015 >> same sxs (Non-ketotic Hgly - 200s).   Since then, he remained on a basal/bolus insulin regimen.   He saw Pincus Large for carb counting teaching.  He feels comfortable with this.   We checked him for type 1 diabetes 2x and the investigation was negative.  Reviewed HbA1c levels Lab Results  Component Value Date   HGBA1C 6.4 (A) 06/07/2022   HGBA1C 6.2 (A) 01/25/2022   HGBA1C 6.5 (A) 09/17/2021  08/03/2016: HbA1c calculated from the fructosamine is 7.17%.  He is on:  - Metformin ER 500 mg 2x a day with meals >> 1000 mg at dinner  - Jardiance 25 mg daily   - Trulicity 1.5 >> 3 weekly   - Tresiba 36 units daily - Occasionally Novolog 2-4 units for correction; added 6-10 units NovoLog before dinner >> 4 units NovoLog prn >> 0-5 units before dinner  He tried regular  Meformin >> bloating, AP  Pt checks his sugars more than 4 times a day per review of his CGM downloads:  Previously:  Previously:   Lowest sugar was 53 >> 59 >> 55 (exercise) >> 50s during the night; hypoglycemia awareness in the 80s. Highest sugar was 381 >> .Marland KitchenMarland Kitchen 300s >> 200s >> 243.  Glucometer: OneTouch Verio IQ >> AccuChek Guide  No CKD, last BUN/creatinine:  Lab Results  Component Value Date   BUN 17 01/25/2022   CREATININE 0.92 01/25/2022   + HL: Last set of lipids: Lab Results  Component Value Date   CHOL 195 01/25/2022   HDL 51.50 01/25/2022   LDLCALC 115 (H) 01/25/2022   TRIG 141.0 01/25/2022   CHOLHDL 4 01/25/2022  05/08/2019: 190/111/40/130 He is not on a statin.  - last eye exam was 04/2022: No DR reportedly.  Has astigmatism.  -+ Only occasional slight numbness and tingling in his feet.  Last foot exam 09/17/2021.  Previously on Neurontin, then off His symptoms improved after starting on alpha lipoic acid and B12 vitamin.  In 04/2017, we started B12 1000 mcg daily.  Reviewed vitamin B12 levels: Lab Results  Component Value Date   VITAMINB12 232 01/25/2022   VITAMINB12 246 01/12/2021   VITAMINB12 616 01/26/2019   VITAMINB12 946 (H) 12/02/2017   VITAMINB12 383 04/14/2017   VITAMINB12 261 06/25/2015  05/08/2019: Vitamin B12 415  Reviewed his TFTs: 05/08/2019: TSH 2.21 Lab Results  Component Value Date  TSH 1.55 01/26/2019   He had Covid19 in 09/2020. He had vertigo, dizziness, nausea, mm aches, HA, fever (103F).  Sugars were worse (300s).  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  His DM control improved. He was dx'ed with ADHD at the beginning of 2024. Started Adderall.  ROS: + see HPI  I reviewed pt's medications, allergies, PMH, social hx, family hx, and changes were documented in the history of present illness. Otherwise, unchanged from my initial visit  note.  Past Medical History:  Diagnosis Date   Anxiety    Depression    Diabetes mellitus without complication ([facility]) 05/2015   NEW ONSET   Past Surgical History:  Procedure Laterality Date   APPENDECTOMY     Social History   Social History   Marital Status: Single    Spouse Name: N/A   Number of Children: 0   Occupational History    Retail buyer    Social History Main Topics   Smoking status: Former Smoker -- 0.25 packs/day - quit 2017   Smokeless tobacco: Never Used   Alcohol Use: Yes     Comment: occ   Drug Use: No   Sexual Activity: Yes   Current Outpatient Medications on File Prior to Visit  Medication Sig Dispense Refill   Accu-Chek Softclix Lancets lancets Use as instructed 2x a day 100 each 3   BD PEN NEEDLE NANO 2ND GEN 32G X 4 MM MISC USE AS DIRECTED TWICE DAILY 100 each 5   Continuous Glucose Sensor (FREESTYLE LIBRE 3 SENSOR) MISC 1 EACH BY DOES NOT APPLY ROUTE EVERY 14 DAYS 6 each 1   Dulaglutide (TRULICITY) 3 MG/0.5ML SOPN Inject 3 mg into the skin once a week. 6 mL 3   empagliflozin (JARDIANCE) 25 MG TABS tablet Take 1 tablet (25 mg total) by mouth daily. 90 tablet 1   glucose blood (ACCU-CHEK GUIDE) test strip Use as instructed 2x a day 100 each 3   Insulin Pen Needle 31G X 6 MM MISC 1 Device by Does not apply route 2 (two) times daily. 60 each 3   metFORMIN (GLUCOPHAGE-XR) 500 MG 24 hr tablet TAKE 1 TABLET BY MOUTH TWICE DAILY 180 tablet 1   NOVOLOG FLEXPEN 100 UNIT/ML FlexPen ADMINISTER 0 TO 6 UNITS UNDER THE SKIN THREE TIMES DAILY BEFORE MEALS. MAKE AN APPOINTMENT 15 mL 1   TRESIBA FLEXTOUCH 200 UNIT/ML FlexTouch Pen ADMINISTER 36 UNITS UNDER THE SKIN DAILY 12 mL 1   No current facility-administered medications on file prior to visit.   No Known Allergies   FH: - Maternal aunt with diabetes type 2, insulin-dependent - No known family history of autoimmune diseases.  PE: BP 138/80   Pulse 99   Ht 6\' 1"  (1.854 m)   Wt 193 lb 6.4 oz (87.7 kg)    SpO2 97%   BMI 25.52 kg/m   Wt Readings from Last 3 Encounters:  10/15/22 193 lb 6.4 oz (87.7 kg)  06/07/22 207 lb (93.9 kg)  01/25/22 208 lb 12.8 oz (94.7 kg)   Constitutional: normal weight, in NAD, very tanned Eyes:  EOMI, no exophthalmos ENT: no neck masses, no cervical lymphadenopathy Cardiovascular: tachycardia, RR, No MRG Respiratory: CTA B Musculoskeletal: no deformities Skin:no rashes Neurological: no tremor with outstretched hands Diabetic Foot Exam - Simple   Simple Foot Form Diabetic Foot exam was performed with the following findings: Yes 10/15/2022  1:47 PM  Visual Inspection No deformities, no ulcerations, no other skin breakdown bilaterally: Yes Sensation Testing Intact to touch and monofilament testing bilaterally: Yes Pulse Check Posterior Tibialis and Dorsalis pulse intact bilaterally: Yes Comments     ASSESSMENT: 1. DM2, insulin-dependent, with complications - Peripheral neuropathy  Component     Latest Ref Rng 05/29/2015  Glutamic Acid Decarb Ab     0.0 - 5.0 U/mL <5.0   Component     Latest Ref Rng 06/25/2015  C-Peptide     0.80-3.85 ng/mL 0.87  Glucose, Fasting     65 - 99 mg/dL 161 (H)  Pancreatic Islet Cell Antibody     < 5 JDF Units <5  Vitamin B12     211 - 911 pg/mL 261  Anti-pancreatic antibodies negative. C-peptide low normal. He may have a degree of insulin deficiency, but this was not clearly evident...  Component     Latest Ref Rng & Units 02/05/2016  Hemoglobin A1C      5.3  C-Peptide     0.80 - 3.85 ng/mL 1.33  Glucose, Fasting     65 - 99 mg/dL 97  Labs again indicated good insulin production...  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 Low B12  PLAN:  1. Patient with history of uncontrolled insulin-dependent type 2 diabetes, with improved blood sugars after decreasing stress at work and starting a CGM.  At last visit, HbA1c was at goal, at 6.4%.  At that time sugars are fluctuating higher in the target range with spikes above 180 after every  meal and also during the night, at approximately 4 AM.  He felt that this was related to not exercising due to anxiety.  He was hoping to start ADHD treatment.  I did not suggest a change in regimen pending resuming exercise.  We discussed about his snack before exercise, since he was dropping his sugars with consistent exercise in the past.  He was having carbohydrate rich snack: Banana or apple and after exercise of breakfast including eggs.  He was taking NovoLog in the morning appropriately.  I advised him to try to add some protein to the snack before exercise, which sometimes can prevent hypoglycemia immediately after exercise.  I gave him examples. CGM interpretation: -At today's visit, we reviewed his CGM downloads: It appears that 96% of values are in target range (goal >70%), while 3% are higher than 180 (goal <25%), and 1% are lower than 70 (goal <4%).  The calculated average blood sugar is 121.  The projected HbA1c for the next 3 months (GMI) is 6.2%. -Reviewing the CGM trends, sugars appear to be fluctuating up almost entirely within the target range with only mild hyperglycemic spikes and occasional lower blood sugars especially towards the end of the night and during the morning.  He does mention that occasionally blood sugars have been in the 50s.  Since last visit, he started to exercise consistently and he is improving his diet.  He was started on Adderall, which helps tremendously.  His anxiety resolved and sleep improved so he can focus on his diabetes control more.  He noticed that whenever he is taking NovoLog, sugars may drop too low so I advised him to stop it completely.  We also need to back off his [place] dose. -Unfortunately, he had a hard time obtaining Trulicity and at today's visit I gave him a written prescription to take to the pharmacy downstairs.  If he is not able to find this, I also sent a prescription  for Ozempic to his pharmacy.  I advised him to start at a low dose and  increase as needed. - I suggested to:  Patient Instructions  Please continue:  - Metformin ER 1000 mg at night  - Jardiance 25 mg daily  - Trulicity 3 mg weekly/Ozempic 0.5 mg weekly (start at 0.25 mg weekly)  Please reduce: - Tresiba 30 units daily  Please return in 4 months.  - we checked his HbA1c: 5.6% (lower!)  - advised to check sugars at different times of the day - 4x a day, rotating check times - advised for yearly eye exams >> he is UTD - return to clinic in 4 months  2. Peripheral neuropathy  -His symptoms improved significantly after starting B12 supplementation and also alpha-lipoic acid-now only rarely gets numbness and tingling -He continues on the above supplements  3.  Low B12 vitamin -Balance problems improved after starting supplementation with B12, currently on 1000 mcg daily -Latest B12 level was normal but at the lower end of the target range: Lab Results  Component Value Date   VITAMINB12 232 01/25/2022  -Will recheck this at next visit  Carlus Pavlov, MD PhD [facility] Endocrinology

## 2022-10-15 NOTE — Patient Instructions (Addendum)
Please continue:  - Metformin ER 1000 mg at night  - Jardiance 25 mg daily  - Trulicity 3 mg weekly/Ozempic 0.5 mg weekly (start at 0.25 mg weekly)  Please reduce: - Tresiba 30 units daily  Please return in 4 months.

## 2022-11-29 ENCOUNTER — Encounter: Payer: Self-pay | Admitting: Internal Medicine

## 2022-11-30 ENCOUNTER — Other Ambulatory Visit: Payer: Self-pay

## 2022-11-30 MED ORDER — METFORMIN HCL ER 500 MG PO TB24: ORAL_TABLET | ORAL | 1 refills | Status: DC

## 2022-12-08 ENCOUNTER — Other Ambulatory Visit: Payer: Self-pay | Admitting: Internal Medicine

## 2022-12-08 DIAGNOSIS — E1142 Type 2 diabetes mellitus with diabetic polyneuropathy: Secondary | ICD-10-CM

## 2023-01-14 ENCOUNTER — Encounter: Payer: Self-pay | Admitting: Internal Medicine

## 2023-01-17 ENCOUNTER — Telehealth: Payer: Self-pay

## 2023-01-17 MED ORDER — BD PEN NEEDLE NANO 2ND GEN 32G X 4 MM MISC: 5 refills | Status: DC

## 2023-01-17 NOTE — Telephone Encounter (Signed)
Pt needs a PA for Ozempic

## 2023-01-18 ENCOUNTER — Other Ambulatory Visit (HOSPITAL_COMMUNITY): Payer: Self-pay

## 2023-01-18 NOTE — Telephone Encounter (Signed)
LMTRC  JMiller,RMA 

## 2023-01-19 ENCOUNTER — Other Ambulatory Visit: Payer: Self-pay | Admitting: Internal Medicine

## 2023-01-19 NOTE — Telephone Encounter (Signed)
Sent message to patient on MyChart.

## 2023-01-20 NOTE — Telephone Encounter (Signed)
Patient read the MyChart message that was sent regarding this matter.

## 2023-01-27 ENCOUNTER — Encounter: Payer: Self-pay | Admitting: Internal Medicine

## 2023-01-27 ENCOUNTER — Telehealth: Payer: Self-pay

## 2023-01-27 DIAGNOSIS — E1142 Type 2 diabetes mellitus with diabetic polyneuropathy: Secondary | ICD-10-CM

## 2023-01-27 MED ORDER — TRULICITY 3 MG/0.5ML ~~LOC~~ SOAJ
3.0000 mg | SUBCUTANEOUS | 3 refills | Status: DC
Start: 2023-01-27 — End: 2023-01-27

## 2023-01-27 MED ORDER — TRULICITY 3 MG/0.5ML ~~LOC~~ SOAJ
3.0000 mg | SUBCUTANEOUS | 3 refills | Status: DC
Start: 2023-01-27 — End: 2023-02-15

## 2023-01-27 NOTE — Telephone Encounter (Signed)
Pt needs PA for Trulicity

## 2023-01-28 ENCOUNTER — Encounter: Payer: Self-pay | Admitting: Internal Medicine

## 2023-01-28 NOTE — Telephone Encounter (Signed)
Pt partner (Tim) came and picked up 2 boxes of Ozempic

## 2023-01-31 ENCOUNTER — Other Ambulatory Visit (HOSPITAL_COMMUNITY): Payer: Self-pay

## 2023-02-09 ENCOUNTER — Encounter: Payer: Self-pay | Admitting: Internal Medicine

## 2023-02-09 ENCOUNTER — Other Ambulatory Visit (HOSPITAL_COMMUNITY): Payer: Self-pay

## 2023-02-09 ENCOUNTER — Other Ambulatory Visit: Payer: Self-pay

## 2023-02-09 DIAGNOSIS — E1142 Type 2 diabetes mellitus with diabetic polyneuropathy: Secondary | ICD-10-CM

## 2023-02-09 MED ORDER — FREESTYLE LIBRE 3 PLUS SENSOR MISC: 1.0000 | 3 refills | Status: DC

## 2023-02-09 NOTE — Telephone Encounter (Signed)
The insurance info he provided in another encounter is the one we are running a test claim for and a PA is not required. The workman's comp that is listed as his primary will not cover Trulicity.

## 2023-02-09 NOTE — Telephone Encounter (Signed)
Please proceed with PA the Pharmacy has on File:  Primary Visit Coverage  Payer Plan Sponsor Code Group Number Group Name  [facility] COMP [facility] COMP      Primary Visit Coverage Subscriber  ID Name [facility] Address  [id number] [facility] [employer]  [address]     [place], [place] [address]   Secondary Visit Coverage  Payer Plan Sponsor Code Group Number Group Name  [facility] [employer] [place]  [id number] [facility] Plan   Secondary Visit Coverage Subscriber  ID Name SSN Address  [id number] [facility] [id number]     [place], [place] [address]

## 2023-02-09 NOTE — Telephone Encounter (Signed)
Requested Prescriptions   Signed Prescriptions Disp Refills   Continuous Glucose Sensor (FREESTYLE LIBRE 3 PLUS SENSOR) MISC 6 each 3    Sig: Inject 1 Device into the skin continuous. Change every 15 days    Authorizing Provider: Carlus Pavlov    Ordering User: Pollie Meyer

## 2023-02-11 NOTE — Telephone Encounter (Signed)
Dr. Elvera Lennox is there anything else you could advise. We have tried multiple times to do a PA and on our PA team side its stating no PA is required but when I contacted the pharmacy, its showing that he needs one. Patient has not had Trulicity, I provided him with 2 boxes of Ozempic a few days/ weeks ago.

## 2023-02-11 NOTE — Telephone Encounter (Signed)
J, We can go back to Ozempic, the same dose as before, if covered - can re-send a PA for this mentioning that he is not able to get Trulicity... ?  I do not understand why he cannot get Trulicity since this appears to be covered with a $30 co-pay by his insurance... He may need to call his insurance himself to clarify the Trulicity issue telling him what you mentioned in the message.

## 2023-02-14 MED ORDER — OZEMPIC (0.25 OR 0.5 MG/DOSE) 2 MG/1.5ML ~~LOC~~ SOPN: 0.5000 mg | PEN_INJECTOR | SUBCUTANEOUS | 1 refills | Status: DC

## 2023-02-14 NOTE — Addendum Note (Signed)
Addended by: Pollie Meyer on: 02/14/2023 09:46 AM   Modules accepted: Orders

## 2023-02-15 ENCOUNTER — Ambulatory Visit: Payer: BC Managed Care – PPO | Admitting: Internal Medicine

## 2023-02-15 ENCOUNTER — Encounter: Payer: Self-pay | Admitting: Internal Medicine

## 2023-02-15 ENCOUNTER — Telehealth: Payer: Self-pay

## 2023-02-15 VITALS — BP 120/60 | HR 98 | Ht 73.0 in | Wt 181.4 lb

## 2023-02-15 DIAGNOSIS — Z794 Long term (current) use of insulin: Secondary | ICD-10-CM | POA: Diagnosis not present

## 2023-02-15 DIAGNOSIS — E538 Deficiency of other specified B group vitamins: Secondary | ICD-10-CM

## 2023-02-15 DIAGNOSIS — Z7985 Long-term (current) use of injectable non-insulin antidiabetic drugs: Secondary | ICD-10-CM | POA: Diagnosis not present

## 2023-02-15 DIAGNOSIS — E1165 Type 2 diabetes mellitus with hyperglycemia: Secondary | ICD-10-CM

## 2023-02-15 DIAGNOSIS — Z7984 Long term (current) use of oral hypoglycemic drugs: Secondary | ICD-10-CM

## 2023-02-15 DIAGNOSIS — E1142 Type 2 diabetes mellitus with diabetic polyneuropathy: Secondary | ICD-10-CM | POA: Diagnosis not present

## 2023-02-15 NOTE — Telephone Encounter (Signed)
Sample  Medication:  Ozempic  Dose: 0.25/0.5 mg Quantity:1 box ZOX:[id number] EXP:04/11/2024  Dicie Beam

## 2023-02-15 NOTE — Progress Notes (Unsigned)
Patient ID: Greg Goodman, male   DOB: Aug 03, 1980, 42 y.o.   MRN: [id number]  HPI: Henrik Orihuela [facility]) is a 42 y.o.-year-old male, returning for follow-up for DM2, dx in 05/2015, insulin-dependent, uncontrolled, with complications (peripheral neuropathy). Last visit 4 mo ago.  Interim history: No increased urination, blurry vision, nausea, chest pain. At last visit, after starting generic Adderall, he felt much better and able to exercise consistently, sleep well, had less anxiety and fatigue and was working on improving diet.  He lost 10 pounds before last visit!  He continues with exercise and diet.  He lost 12 more pounds since then. 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  Reviewed history: In 05/2014 >> started to feel poorly, with dizziness, lightheadedness, and chest pressure. Retrospectively, he also had increased thirst, increased urination, increased hunger with unintentional weight loss (40 lbs in 1 year!), blurry vision.  He went to UC >> CBG: "HI" >> sent to ED: 561. He was hospitalized and then discharged on insulin.  He was in the ED 2x after the initial admission >> weak, blurry vision - 06/02/2015 (Non-ketotic Hgly - 200-300). Then, he was in the ED (High Pt Reg) 06/17/2015 >> same sxs (Non-ketotic Hgly - 200s).   Since then, he remained on a basal/bolus insulin regimen.   He saw Pincus Large for carb counting teaching.  He feels comfortable with this.   We checked him for type 1 diabetes 2x and the investigation was negative.  Reviewed HbA1c levels Lab Results  Component Value Date   HGBA1C 5.6 10/15/2022   HGBA1C 6.4 (A) 06/07/2022   HGBA1C 6.2 (A) 01/25/2022  08/03/2016: HbA1c calculated from the fructosamine is 7.17%.  He is on:  - Metformin ER 500 mg 2x a day with meals >> 1000 mg at dinner  - Jardiance 25 mg daily   - Trulicity 1.5 >> 3 weekly or Ozempic 0.5 mg weekly   - Tresiba 36 >> 30 units daily - Occasionally Novolog 2-4 units for  correction; added 6-10 units NovoLog before dinner >> 4 units NovoLog prn >> 0-5 units before dinner  >> off He tried regular Meformin >> bloating, AP  Pt checks his sugars more than 4 times a day per review of his CGM downloads:  Previously:  Previously:   Lowest sugar was 55 (exercise) >> 50s during the night; hypoglycemia awareness in the 80s. Highest sugar was 381 >> .Marland KitchenMarland Kitchen 300s >> 200s >> 243.  Glucometer: OneTouch Verio IQ >> AccuChek Guide  No CKD, last BUN/creatinine:  Lab Results  Component Value Date   BUN 17 01/25/2022   CREATININE 0.92 01/25/2022   + HL: Last set of lipids: Lab Results  Component Value Date   CHOL 195 01/25/2022   HDL 51.50 01/25/2022   LDLCALC 115 (H) 01/25/2022   TRIG 141.0 01/25/2022   CHOLHDL 4 01/25/2022  05/08/2019: 190/111/40/130 He is not on a statin.  - last eye exam was 04/2022: No DR reportedly.  Has astigmatism.  -+ Only occasional slight numbness and tingling in his feet.  Last foot exam 10/15/2022.  Previously on Neurontin, then off His symptoms improved after starting on alpha lipoic acid and B12 vitamin.  In 04/2017, we started B12 1000 mcg daily.  Reviewed vitamin B12 levels: Lab Results  Component Value Date   VITAMINB12 232 01/25/2022   VITAMINB12 246 01/12/2021   VITAMINB12 616 01/26/2019   VITAMINB12 946 (H) 12/02/2017   VITAMINB12 383 04/14/2017   VITAMINB12 261  06/25/2015  05/08/2019: Vitamin B12 415  Reviewed his TFTs: 05/08/2019: TSH 2.21 Lab Results  Component Value Date   TSH 1.55 01/26/2019   He had Covid19 in 09/2020. He had vertigo, dizziness, nausea, mm aches, HA, fever (103F).  Sugars were worse (300s).  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  His DM control improved. He was dx'ed with ADHD at the beginning of 2024. On Adderall.  ROS: + see HPI  I reviewed pt's medications, allergies, PMH, social hx, family hx,  and changes were documented in the history of present illness. Otherwise, unchanged from my initial visit note.  Past Medical History:  Diagnosis Date   Anxiety    Depression    Diabetes mellitus without complication (HCC) 05/2015   NEW ONSET   Past Surgical History:  Procedure Laterality Date   APPENDECTOMY     Social History   Social History   Marital Status: Single    Spouse Name: N/A   Number of Children: 0   Occupational History    Retail buyer    Social History Main Topics   Smoking status: Former Smoker -- 0.25 packs/day - quit 2017   Smokeless tobacco: Never Used   Alcohol Use: Yes     Comment: occ   Drug Use: No   Sexual Activity: Yes   Current Outpatient Medications on File Prior to Visit  Medication Sig Dispense Refill   Accu-Chek Softclix Lancets lancets Use as instructed 2x a day 100 each 3   amphetamine-dextroamphetamine (ADDERALL XR) 20 MG 24 hr capsule Take 20 mg by mouth daily.     Continuous Glucose Sensor (FREESTYLE LIBRE 3 PLUS SENSOR) MISC Inject 1 Device into the skin continuous. Change every 15 days 6 each 3   Dulaglutide (TRULICITY) 3 MG/0.5ML SOPN Inject 3 mg into the skin once a week. 6 mL 3   glucose blood (ACCU-CHEK GUIDE) test strip Use as instructed 2x a day 100 each 3   Insulin Pen Needle (BD PEN NEEDLE NANO 2ND GEN) 32G X 4 MM MISC USE AS DIRECTED TWICE DAILY 100 each 2   Insulin Pen Needle 31G X 6 MM MISC 1 Device by Does not apply route 2 (two) times daily. 60 each 3   JARDIANCE 25 MG TABS tablet TAKE 1 TABLET(25 MG) BY MOUTH DAILY 90 tablet 1   metFORMIN (GLUCOPHAGE-XR) 500 MG 24 hr tablet TAKE 1 TABLET BY MOUTH TWICE DAILY 180 tablet 1   NOVOLOG FLEXPEN 100 UNIT/ML FlexPen ADMINISTER 0 TO 6 UNITS UNDER THE SKIN THREE TIMES DAILY BEFORE MEALS. MAKE AN APPOINTMENT 15 mL 1   Semaglutide,0.25 or 0.5MG /DOS, (OZEMPIC, 0.25 OR 0.5 MG/DOSE,) 2 MG/1.5ML SOPN Inject 0.5 mg into the skin once a week. 9 mL 1   TRESIBA FLEXTOUCH 200 UNIT/ML  FlexTouch Pen ADMINISTER 36 UNITS UNDER THE SKIN DAILY 12 mL 1   No current facility-administered medications on file prior to visit.   No Known Allergies   FH: - Maternal aunt with diabetes type 2, insulin-dependent - No known family history of autoimmune diseases.  PE: BP 120/60   Pulse 98   Ht 6\' 1"  (1.854 m)   Wt 181 lb 6.4 oz (82.3 kg)   SpO2 99%   BMI 23.93 kg/m   Wt Readings from Last 3 Encounters:  02/15/23 181 lb 6.4 oz (82.3 kg)  10/15/22 193 lb 6.4 oz (87.7 kg)  06/07/22 207 lb (  93.9 kg)   Constitutional: normal weight, in NAD, Eyes:  EOMI, no exophthalmos ENT: no neck masses, no cervical lymphadenopathy Cardiovascular: tachycardia, RR, No MRG Respiratory: CTA B Musculoskeletal: no deformities Skin:no rashes Neurological: no tremor with outstretched hands.  ASSESSMENT: 1. DM2, insulin-dependent, with complications - Peripheral neuropathy  Component     Latest Ref Rng 05/29/2015  Glutamic Acid Decarb Ab     0.0 - 5.0 U/mL <5.0   Component     Latest Ref Rng 06/25/2015  C-Peptide     0.80-3.85 ng/mL 0.87  Glucose, Fasting     65 - 99 mg/dL 161 (H)  Pancreatic Islet Cell Antibody     < 5 JDF Units <5  Vitamin B12     211 - 911 pg/mL 261  Anti-pancreatic antibodies negative. C-peptide low normal. He may have a degree of insulin deficiency, but this was not clearly evident...  Component     Latest Ref Rng & Units 02/05/2016  Hemoglobin A1C      5.3  C-Peptide     0.80 - 3.85 ng/mL 1.33  Glucose, Fasting     65 - 99 mg/dL 97  Labs again indicated good insulin production...  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 Low B12  PLAN:  1. Patient with history of uncontrolled insulin-dependent type 2 diabetes, with improved blood sugars control before last visit after starting generic Adderall.  He was sleeping better, anxiety and depression were much better, we was able to improve diet and exercise consistently.  His HbA1c returned at 5.6%, decreased from 6.4%.  Sugars are  fluctuating within a narrow range in the target interval with only mild hyperglycemic spikes and occasional low blood sugar especially towards the end of the night and during the morning.  He also saw some blood sugars in the 50s.  I advised him to reduce the Tresiba dose and discussed about trying to stop NovoLog completely.  He was using this sparingly, before certain meals. -Since then, he had problems obtaining Trulicity and was switched to Ozempic CGM interpretation: -At today's visit, we reviewed his CGM downloads: It appears that 90% of values are in target range (goal >70%), while 10% are higher than 180 (goal <25%), and 0% are lower than 70 (goal <4%).  The calculated average blood sugar is 144.  The projected HbA1c for the next 3 months (GMI) is 6.8%. -Reviewing the CGM trends, sugars appear to be slightly higher in the last 2 weeks, mostly increasing around 2 AM and staying slightly elevated until 12 PM when they start to decrease.  He is not quite sure what prompted the increase in blood sugars.  He has been on Ozempic for 2 weeks and he feels that this is stronger than Trulicity.  Will try to continue with this.  He had problems refilling it so we gave him a sample at today's visit.  Another prescription was sent for him yesterday.   -For now, I did not recommend a change in regimen, but if sugars continue to increase, we may need to increase his basal insulin dose. - I suggested to:  Patient Instructions  Please continue:  - Metformin ER 1000 mg at night  - Jardiance 25 mg daily  - Ozempic 0.5 mg weekly - Tresiba 30 units daily  Please return in 4 months.  - we checked his HbA1c: 6.1% (slightly higher) - advised to check sugars at different times of the day - 4x a day, rotating check times - advised for yearly  eye exams >> he is UTD - return to clinic in 4 months  2. Peripheral neuropathy  -His symptoms improved significantly after starting B12 supplementation and also  alpha-lipoic acid-now only rarely gets numbness and tingling -He continues on the above supplements  3.  Low B12 vitamin -Balance problems improved after starting B12 - now on 1000 mcg daily -B12 level was still low in the normal range at last check: Lab Results  Component Value Date   VITAMINB12 232 01/25/2022  -Will recheck today  Carlus Pavlov, MD PhD [facility] Endocrinology

## 2023-02-15 NOTE — Patient Instructions (Addendum)
Please continue:  - Metformin ER 1000 mg at night  - Jardiance 25 mg daily  - Ozempic 0.5 mg weekly - Tresiba 30 units daily  Please return in 4 months.

## 2023-02-16 ENCOUNTER — Other Ambulatory Visit: Payer: Self-pay

## 2023-02-16 DIAGNOSIS — E1165 Type 2 diabetes mellitus with hyperglycemia: Secondary | ICD-10-CM

## 2023-02-16 LAB — COMPREHENSIVE METABOLIC PANEL
ALT: 26 U/L (ref 0–53)
AST: 23 U/L (ref 0–37)
Albumin: 4.6 g/dL (ref 3.5–5.2)
Alkaline Phosphatase: 59 U/L (ref 39–117)
BUN: 17 mg/dL (ref 6–23)
CO2: 27 meq/L (ref 19–32)
Calcium: 9.4 mg/dL (ref 8.4–10.5)
Chloride: 103 meq/L (ref 96–112)
Creatinine, Ser: 0.9 mg/dL (ref 0.40–1.50)
GFR: 106.52 mL/min (ref >60.00)
Glucose, Bld: 124 mg/dL — ABNORMAL HIGH (ref 70–99)
Potassium: 4.2 meq/L (ref 3.5–5.1)
Sodium: 137 meq/L (ref 135–145)
Total Bilirubin: 0.6 mg/dL (ref 0.2–1.2)
Total Protein: 7.2 g/dL (ref 6.0–8.3)

## 2023-02-16 LAB — LIPID PANEL
Cholesterol: 156 mg/dL (ref 0–200)
HDL: 56.6 mg/dL (ref >39.00)
LDL Cholesterol: 79 mg/dL (ref 0–99)
NonHDL: 99.73
Total CHOL/HDL Ratio: 3
Triglycerides: 104 mg/dL (ref 0.0–149.0)
VLDL: 20.8 mg/dL (ref 0.0–40.0)

## 2023-02-16 LAB — MICROALBUMIN / CREATININE URINE RATIO
Creatinine,U: 74.4 mg/dL
Microalb Creat Ratio: 3.2 mg/g (ref 0.0–30.0)
Microalb, Ur: 2.4 mg/dL — ABNORMAL HIGH (ref 0.0–1.9)

## 2023-02-16 LAB — TSH: TSH: 1.85 u[IU]/mL (ref 0.35–5.50)

## 2023-02-16 LAB — VITAMIN B12: Vitamin B-12: 580 pg/mL (ref 211–911)

## 2023-02-16 MED ORDER — FREESTYLE LIBRE 3 PLUS SENSOR MISC: 1.0000 | 3 refills | Status: DC

## 2023-02-18 ENCOUNTER — Other Ambulatory Visit: Payer: Self-pay

## 2023-02-18 ENCOUNTER — Encounter: Payer: Self-pay | Admitting: Internal Medicine

## 2023-02-18 DIAGNOSIS — E1142 Type 2 diabetes mellitus with diabetic polyneuropathy: Secondary | ICD-10-CM

## 2023-02-18 MED ORDER — TRESIBA FLEXTOUCH 200 UNIT/ML ~~LOC~~ SOPN: PEN_INJECTOR | SUBCUTANEOUS | 1 refills | Status: DC

## 2023-02-22 ENCOUNTER — Other Ambulatory Visit: Payer: Self-pay

## 2023-02-22 DIAGNOSIS — E1165 Type 2 diabetes mellitus with hyperglycemia: Secondary | ICD-10-CM

## 2023-02-22 MED ORDER — FREESTYLE LIBRE 3 PLUS SENSOR MISC: 1.0000 | 3 refills | Status: DC

## 2023-03-01 ENCOUNTER — Other Ambulatory Visit (HOSPITAL_COMMUNITY): Payer: Self-pay

## 2023-03-01 ENCOUNTER — Telehealth: Payer: Self-pay

## 2023-03-01 NOTE — Telephone Encounter (Signed)
Pharmacy Patient Advocate Encounter   Received notification from CoverMyMeds that prior authorization for Ozempic (0.25 or 0.5 MG/DOSE) 2MG /3ML pen-injectors is required/requested.   Insurance verification completed.   The patient is insured through [facility] .   Per test claim: PA required; PA submitted to above mentioned insurance via CoverMyMeds Key/confirmation #/EOC [id number] Status is pending

## 2023-03-02 NOTE — Telephone Encounter (Signed)
Pharmacy Patient Advocate Encounter  Received notification from [facility] that Prior Authorization for Ozempic has been APPROVED from 03-01-2023 to 02-28-2026   PA #/Case ID/Reference #: [id number]

## 2023-06-17 ENCOUNTER — Ambulatory Visit: Payer: BC Managed Care – PPO | Admitting: Internal Medicine

## 2023-06-17 NOTE — Progress Notes (Deleted)
 Patient ID: Greg Goodman, male   DOB: [birth year], 43 y.o.   MRN: [id number]  HPI: Greg Goodman) is a 43 y.o.-year-old male, returning for follow-up for DM2, dx in 05/2015, insulin-dependent, uncontrolled, with complications (peripheral neuropathy). Last visit 4 mo ago.  Interim history: No increased urination, blurry vision, nausea, chest pain. 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  Reviewed history: In 05/2014 >> started to feel poorly, with dizziness, lightheadedness, and chest pressure. Retrospectively, he also had increased thirst, increased urination, increased hunger with unintentional weight loss (40 lbs in 1 year!), blurry vision.  He went to UC >> CBG: "HI" >> sent to ED: 561. He was hospitalized and then discharged on insulin.  He was in the ED 2x after the initial admission >> weak, blurry vision - 06/02/2015 (Non-ketotic Hgly - 200-300). Then, he was in the ED (High Pt Reg) 06/17/2015 >> same sxs (Non-ketotic Hgly - 200s).   Since then, he remained on a basal/bolus insulin regimen.   He saw Pincus Large for carb counting teaching.  He feels comfortable with this.   We checked him for type 1 diabetes 2x and the investigation was negative.  Reviewed HbA1c levels 02/15/2023: HbA1c 6.1% Lab Results  Component Value Date   HGBA1C 5.6 10/15/2022   HGBA1C 6.4 (A) 06/07/2022   HGBA1C 6.2 (A) 01/25/2022  08/03/2016: HbA1c calculated from the fructosamine is 7.17%.  He is on:  - Metformin ER 500 mg 2x a day with meals >> 1000 mg at dinner  - Jardiance 25 mg daily   - Trulicity 1.5 >> 3 mg weekly >> Ozempic 0.5 mg weekly   - Tresiba 36 >> 30 units daily He was previously on Novolog 2-4 units for correction; added 6-10 units NovoLog before dinner >> 4 units NovoLog prn >> 0-5 units before dinner  >> off He tried regular Meformin >> bloating, AP  Pt checks his sugars more than 4 times a day per review of his CGM  downloads:  Previously:  Previously:  Lowest sugar was 55 (exercise) >> 50s during the night; hypoglycemia awareness in the 80s. Highest sugar was 381 >> ...  243.  Glucometer: OneTouch Verio IQ >> AccuChek Guide  No CKD, last BUN/creatinine:  Lab Results  Component Value Date   BUN 17 02/15/2023   CREATININE 0.90 02/15/2023   Lab Results  Component Value Date   MICRALBCREAT 3.2 02/15/2023   MICRALBCREAT 1.4 01/25/2022   MICRALBCREAT 1.6 01/12/2021   MICRALBCREAT 7 01/26/2019   MICRALBCREAT 1.2 12/02/2017   MICRALBCREAT 1.1 08/03/2016   MICRALBCREAT 1.3 10/02/2015   + HL: Last set of lipids: Lab Results  Component Value Date   CHOL 156 02/15/2023   HDL 56.60 02/15/2023   LDLCALC 79 02/15/2023   TRIG 104.0 02/15/2023   CHOLHDL 3 02/15/2023  05/08/2019: 190/111/40/130 He is not on a statin.  - last eye exam was 04/2022: No DR reportedly.  Has astigmatism.  -+ Only occasional slight numbness and tingling in his feet.  Last foot exam 10/15/2022.  Previously on Neurontin, then off His symptoms improved after starting on alpha lipoic acid and B12 vitamin.  In 04/2017, we started B12 1000 mcg daily.  Reviewed vitamin B12 levels: Lab Results  Component Value Date   VITAMINB12 580 02/15/2023   VITAMINB12 232 01/25/2022   VITAMINB12 246 01/12/2021   VITAMINB12 616 01/26/2019   VITAMINB12 946 (H) 12/02/2017   VITAMINB12 383 04/14/2017   VITAMINB12 261 06/25/2015  05/08/2019:  Vitamin B12 415  Reviewed his TFTs: Lab Results  Component Value Date   TSH 1.85 02/15/2023  05/08/2019: TSH 2.21  He had Covid19 in 09/2020. He had vertigo, dizziness, nausea, mm aches, HA, fever (103F).  Sugars were worse (300s).  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  His DM control improved. He was dx'ed with ADHD at the beginning of 2024. On Adderall.  ROS: + see HPI  I reviewed pt's medications,  allergies, PMH, social hx, family hx, and changes were documented in the history of present illness. Otherwise, unchanged from my initial visit note.  Past Medical History:  Diagnosis Date   Anxiety    Depression    Diabetes mellitus without complication (HCC) 05/2015   NEW ONSET   Past Surgical History:  Procedure Laterality Date   APPENDECTOMY     Social History   Social History   Marital Status: Single    Spouse Name: N/A   Number of Children: 0   Occupational History    Retail buyer    Social History Main Topics   Smoking status: Former Smoker -- 0.25 packs/day - quit 2017   Smokeless tobacco: Never Used   Alcohol Use: Yes     Comment: occ   Drug Use: No   Sexual Activity: Yes   Current Outpatient Medications on File Prior to Visit  Medication Sig Dispense Refill   Accu-Chek Softclix Lancets lancets Use as instructed 2x a day 100 each 3   amphetamine-dextroamphetamine (ADDERALL XR) 20 MG 24 hr capsule Take 20 mg by mouth daily.     Continuous Glucose Sensor (FREESTYLE LIBRE 3 PLUS SENSOR) MISC Inject 1 Device into the skin continuous. Change every 15 days 6 each 3   glucose blood (ACCU-CHEK GUIDE) test strip Use as instructed 2x a day 100 each 3   insulin degludec (TRESIBA FLEXTOUCH) 200 UNIT/ML FlexTouch Pen ADMINISTER 36 UNITS UNDER THE SKIN DAILY 12 mL 1   Insulin Pen Needle (BD PEN NEEDLE NANO 2ND GEN) 32G X 4 MM MISC USE AS DIRECTED TWICE DAILY 100 each 2   Insulin Pen Needle 31G X 6 MM MISC 1 Device by Does not apply route 2 (two) times daily. 60 each 3   JARDIANCE 25 MG TABS tablet TAKE 1 TABLET(25 MG) BY MOUTH DAILY 90 tablet 1   metFORMIN (GLUCOPHAGE-XR) 500 MG 24 hr tablet TAKE 1 TABLET BY MOUTH TWICE DAILY 180 tablet 1   NOVOLOG FLEXPEN 100 UNIT/ML FlexPen ADMINISTER 0 TO 6 UNITS UNDER THE SKIN THREE TIMES DAILY BEFORE MEALS. MAKE AN APPOINTMENT 15 mL 1   Semaglutide,0.25 or 0.5MG /DOS, (OZEMPIC, 0.25 OR 0.5 MG/DOSE,) 2 MG/1.5ML SOPN Inject 0.5 mg into the  skin once a week. 9 mL 1   No current facility-administered medications on file prior to visit.   No Known Allergies   FH: - Maternal aunt with diabetes type 2, insulin-dependent - No known family history of autoimmune diseases.  PE: There were no vitals taken for this visit.  Wt Readings from Last 3 Encounters:  02/15/23 181 lb 6.4 oz (82.3 kg)  10/15/22 193 lb 6.4 oz (87.7 kg)  06/07/22 207 lb (93.9 kg)   Constitutional: normal weight, in NAD, Eyes:  EOMI, no exophthalmos ENT: no neck masses, no cervical lymphadenopathy Cardiovascular: tachycardia, RR, No MRG Respiratory: CTA B Musculoskeletal: no deformities Skin:no rashes Neurological: no tremor with outstretched hands.  ASSESSMENT: 1. DM2,  insulin-dependent, with complications - Peripheral neuropathy  Component     Latest Ref Rng 05/29/2015  Glutamic Acid Decarb Ab     0.0 - 5.0 U/mL <5.0   Component     Latest Ref Rng 06/25/2015  C-Peptide     0.80-3.85 ng/mL 0.87  Glucose, Fasting     65 - 99 mg/dL 161 (H)  Pancreatic Islet Cell Antibody     < 5 JDF Units <5  Vitamin B12     211 - 911 pg/mL 261  Anti-pancreatic antibodies negative. C-peptide low normal. He may have a degree of insulin deficiency, but this was not clearly evident...  Component     Latest Ref Rng & Units 02/05/2016  Hemoglobin A1C      5.3  C-Peptide     0.80 - 3.85 ng/mL 1.33  Glucose, Fasting     65 - 99 mg/dL 97  Labs again indicated good insulin production...  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 Low B12  PLAN:  1. Patient with history of uncontrolled insulin-dependent type 2 diabetes, with improvement in control after starting generic Adderall.  He started to sleep better, anxiety and depression were improved and he was able to improve diet and exercise.  His HbA1c decreased to a nadir of 5.6%.  At last visit, this was higher, though, but still at goal, at 6.1%.  He previously had problems obtaining Trulicity and we switched to Ozempic.  We reduced  his long-acting insulin dose and discussed about stopping mealtime insulin completely. -At last visit, sugars appeared to be slightly higher in the previous 2 weeks and he was not sure what could have caused this.  He already started Ozempic 2 weeks prior to the appointment and he felt that this was better than Trulicity.  We did not change his regimen at that time. CGM interpretation: -At today's visit, we reviewed his CGM downloads: It appears that *** of values are in target range (goal >70%), while *** are higher than 180 (goal <25%), and *** are lower than 70 (goal <4%).  The calculated average blood sugar is ***.  The projected HbA1c for the next 3 months (GMI) is ***. -Reviewing the CGM trends, ***  - I suggested to:  Patient Instructions  Please continue:  - Metformin ER 1000 mg at night  - Jardiance 25 mg daily  - Ozempic 0.5 mg weekly - Tresiba 30 units daily  Please return in 4-6 months.  - we checked his HbA1c: 7%  - advised to check sugars at different times of the day - 4x a day, rotating check times - advised for yearly eye exams >> he is UTD - return to clinic in 4-6 months  2. Peripheral neuropathy  -His symptoms improved significantly after starting B12 supplementation and also starting alpha lipoic acid -He only occasionally gets numbness and tingling now -He continues the above supplements  3.  Low B12 vitamin -His balance problems improved after starting B12.  He takes 1000 mcg daily. -B12 level was normal at last visit, on the above dose: Lab Results  Component Value Date   VITAMINB12 580 02/15/2023  -Will continue to keep an eye on this  Carlus Pavlov, MD PhD [facility] Endocrinology

## 2023-07-08 ENCOUNTER — Other Ambulatory Visit: Payer: Self-pay | Admitting: Internal Medicine

## 2023-07-08 DIAGNOSIS — E1142 Type 2 diabetes mellitus with diabetic polyneuropathy: Secondary | ICD-10-CM

## 2023-08-26 ENCOUNTER — Other Ambulatory Visit: Payer: Self-pay | Admitting: Internal Medicine

## 2023-08-26 DIAGNOSIS — E1142 Type 2 diabetes mellitus with diabetic polyneuropathy: Secondary | ICD-10-CM

## 2023-08-26 DIAGNOSIS — E1165 Type 2 diabetes mellitus with hyperglycemia: Secondary | ICD-10-CM

## 2023-09-14 ENCOUNTER — Encounter: Payer: Self-pay | Admitting: Internal Medicine

## 2023-09-14 DIAGNOSIS — E1142 Type 2 diabetes mellitus with diabetic polyneuropathy: Secondary | ICD-10-CM

## 2023-09-15 MED ORDER — TRESIBA FLEXTOUCH 200 UNIT/ML ~~LOC~~ SOPN: PEN_INJECTOR | SUBCUTANEOUS | 0 refills | Status: DC

## 2023-09-27 ENCOUNTER — Encounter: Payer: Self-pay | Admitting: Internal Medicine

## 2023-09-27 ENCOUNTER — Ambulatory Visit: Admitting: Internal Medicine

## 2023-09-27 VITALS — BP 120/70 | HR 95 | Ht 73.0 in | Wt 175.8 lb

## 2023-09-27 DIAGNOSIS — Z7985 Long-term (current) use of injectable non-insulin antidiabetic drugs: Secondary | ICD-10-CM

## 2023-09-27 DIAGNOSIS — Z794 Long term (current) use of insulin: Secondary | ICD-10-CM

## 2023-09-27 DIAGNOSIS — E1165 Type 2 diabetes mellitus with hyperglycemia: Secondary | ICD-10-CM | POA: Diagnosis not present

## 2023-09-27 DIAGNOSIS — E538 Deficiency of other specified B group vitamins: Secondary | ICD-10-CM | POA: Diagnosis not present

## 2023-09-27 DIAGNOSIS — E1142 Type 2 diabetes mellitus with diabetic polyneuropathy: Secondary | ICD-10-CM

## 2023-09-27 DIAGNOSIS — Z7984 Long term (current) use of oral hypoglycemic drugs: Secondary | ICD-10-CM

## 2023-09-27 LAB — POCT GLYCOSYLATED HEMOGLOBIN (HGB A1C): Hemoglobin A1C: 6.6 % — AB (ref 4.0–5.6)

## 2023-09-27 MED ORDER — OZEMPIC (0.25 OR 0.5 MG/DOSE) 2 MG/1.5ML ~~LOC~~ SOPN: 0.5000 mg | PEN_INJECTOR | SUBCUTANEOUS | 3 refills | Status: DC

## 2023-09-27 MED ORDER — METFORMIN HCL ER 500 MG PO TB24: ORAL_TABLET | ORAL | 3 refills | Status: AC

## 2023-09-27 MED ORDER — TRESIBA FLEXTOUCH 200 UNIT/ML ~~LOC~~ SOPN
PEN_INJECTOR | SUBCUTANEOUS | 3 refills | Status: AC
Start: 2023-09-27 — End: ?

## 2023-09-27 MED ORDER — EMPAGLIFLOZIN 25 MG PO TABS: 25.0000 mg | ORAL_TABLET | Freq: Every day | ORAL | 3 refills | Status: AC

## 2023-09-27 NOTE — Progress Notes (Addendum)
 Patient ID: Greg Goodman, male   DOB: June 14, 1980, 43 y.o.   MRN: [id number]  HPI: Greg Goodman) is a 43 y.o.-year-old male, returning for follow-up for DM2, dx in 05/2015, insulin -dependent, uncontrolled, with complications (peripheral neuropathy). Last visit 7 mo ago.  Interim history: No increased urination, blurry vision, nausea, chest pain. He ran out of Tresiba  approximately 2 weeks ago.  He has been using some NovoLog  in this period of time, but he did notice that the sugars were higher. [place] now [place] the beach and he drove for the appointment today and driving back after the appointment. He continues to go to the gym [employer]).  He will start working out with a Systems analyst, now that he reached his target weight.  Reviewed history: In 05/2014 >> started to feel poorly, with dizziness, lightheadedness, and chest pressure. Retrospectively, he also had increased thirst, increased urination, increased hunger with unintentional weight loss (40 lbs in 1 year!), blurry vision.  He went to UC >> CBG: HI >> sent to ED: 561. He was hospitalized and then discharged on insulin .  He was in the ED 2x after the initial admission >> weak, blurry vision - 06/02/2015 (Non-ketotic Hgly - 200-300). Then, he was in the ED (High Pt Reg) 06/17/2015 >> same sxs (Non-ketotic Hgly - 200s).   Since then, he remained on a basal/bolus insulin  regimen.   He saw Greg Goodman for carb counting teaching.  He feels comfortable with this.   We checked him for type 1 diabetes 2x and the investigation was negative.  Reviewed HbA1c levels Lab Results  Component Value Date   HGBA1C 5.6 10/15/2022   HGBA1C 6.4 (A) 06/07/2022   HGBA1C 6.2 (A) 01/25/2022  08/03/2016: HbA1c calculated from the fructosamine is 7.17%.  He is on:  - Metformin  ER 500 mg 2x a day with meals >> 1000 mg at dinner  - Jardiance  25 mg daily   - Trulicity  1.5 >> 3 weekly or Ozempic  0.5 mg weekly   - Tresiba  36 >> 30 units  daily >> off (ran out 2 weeks) - Occasionally Novolog  2-4 units for correction; added 6-10 units NovoLog  before dinner >> 4 units NovoLog  prn >> 0-5 units before dinner  >> off >> using this while out of Tresiba  in the last 2 weeks He tried regular Meformin >> bloating, AP  Pt checks his sugars more than 4 times a day per review of his CGM downloads:  Previously:  Previously:  Lowest sugar was 55 (exercise) >> 50s during the night >> 50s; hypoglycemia awareness in the 80s. Highest sugar was 381 >> .Greg AasAaron Goodman 300s >> 200s >> 243 >> 300.  Glucometer: OneTouch Verio IQ >> AccuChek Guide  No CKD, last BUN/creatinine:  Lab Results  Component Value Date   BUN 17 02/15/2023   CREATININE 0.90 02/15/2023   Lab Results  Component Value Date   MICRALBCREAT 3.2 02/15/2023   MICRALBCREAT 1.4 01/25/2022   MICRALBCREAT 1.6 01/12/2021   MICRALBCREAT 7 01/26/2019   MICRALBCREAT 1.2 12/02/2017   MICRALBCREAT 1.1 08/03/2016   MICRALBCREAT 1.3 10/02/2015   + HL: Last set of lipids: Lab Results  Component Value Date   CHOL 156 02/15/2023   HDL 56.60 02/15/2023   LDLCALC 79 02/15/2023   TRIG 104.0 02/15/2023   CHOLHDL 3 02/15/2023  05/08/2019: 190/111/40/130 He is not on a statin.  - last eye exam was 04/2022: No DR reportedly.  Has astigmatism.  -+ Only occasional slight numbness and tingling in his  feet.  Last foot exam 10/15/2022.  Previously on Neurontin , then off His symptoms improved after starting on alpha lipoic acid and B12 vitamin.  In 04/2017, we started B12 1000 mcg daily.  Reviewed vitamin B12 levels: Lab Results  Component Value Date   VITAMINB12 580 02/15/2023   VITAMINB12 232 01/25/2022   VITAMINB12 246 01/12/2021   VITAMINB12 616 01/26/2019   VITAMINB12 946 (H) 12/02/2017   VITAMINB12 383 04/14/2017   VITAMINB12 261 06/25/2015  05/08/2019: Vitamin B12 415  Reviewed his TFTs: Lab Results  Component Value Date   TSH 1.85 02/15/2023  05/08/2019: TSH 2.21  He had  Covid19 in 09/2020. He had vertigo, dizziness, nausea, mm aches, HA, fever (103F).  Sugars were worse (300s).  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  His DM control improved. He was dx'ed with ADHD at the beginning of 2024. On Adderall.  ROS: + see HPI  I reviewed pt's medications, allergies, PMH, social hx, family hx, and changes were documented in the history of present illness. Otherwise, unchanged from my initial visit note.  Past Medical History:  Diagnosis Date   Anxiety    Depression    Diabetes mellitus without complication (HCC) 05/2015   NEW ONSET   Past Surgical History:  Procedure Laterality Date   APPENDECTOMY     Social History   Social History   Marital Status: Single    Spouse Name: N/A   Number of Children: 0   Occupational History    Retail buyer    Social History Main Topics   Smoking status: Former Smoker -- 0.25 packs/day - quit 2017   Smokeless tobacco: Never Used   Alcohol Use: Yes     Comment: occ   Drug Use: No   Sexual Activity: Yes   Current Outpatient Medications on File Prior to Visit  Medication Sig Dispense Refill   Accu-Chek Softclix Lancets lancets Use as instructed 2x a day 100 each 3   amphetamine-dextroamphetamine (ADDERALL XR) 20 MG 24 hr capsule Take 20 mg by mouth daily.     Continuous Glucose Sensor (FREESTYLE LIBRE 3 PLUS SENSOR) MISC Inject 1 Device into the skin continuous. Change every 15 days 6 each 3   glucose blood (ACCU-CHEK GUIDE) test strip Use as instructed 2x a day 100 each 3   insulin  degludec (TRESIBA  FLEXTOUCH) 200 UNIT/ML FlexTouch Pen ADMINISTER 36 UNITS UNDER THE SKIN DAILY 6 mL 0   Insulin  Pen Needle (BD PEN NEEDLE NANO 2ND GEN) 32G X 4 MM MISC USE AS DIRECTED TWICE DAILY 100 each 2   Insulin  Pen Needle 31G X 6 MM MISC 1 Device by Does not apply route 2 (two) times daily. 60 each 3   JARDIANCE  25 MG TABS tablet TAKE 1  TABLET(25 MG) BY MOUTH DAILY 90 tablet 1   metFORMIN  (GLUCOPHAGE -XR) 500 MG 24 hr tablet TAKE 1 TABLET BY MOUTH TWICE DAILY 180 tablet 1   NOVOLOG  FLEXPEN 100 UNIT/ML FlexPen ADMINISTER 0 TO 6 UNITS UNDER THE SKIN THREE TIMES DAILY BEFORE MEALS. MAKE AN APPOINTMENT 15 mL 1   Semaglutide ,0.25 or 0.5MG /DOS, (OZEMPIC , 0.25 OR 0.5 MG/DOSE,) 2 MG/1.5ML SOPN Inject 0.5 mg into the skin once a week. 9 mL 1   No current facility-administered medications on file prior to visit.   No Known Allergies   FH: - Maternal aunt with diabetes type 2, insulin -dependent - No known family history of autoimmune  diseases.  PE: BP 120/70   Pulse 95   Ht 6' 1 (1.854 m)   Wt 175 lb 12.8 oz (79.7 kg)   SpO2 99%   BMI 23.19 kg/m   Wt Readings from Last 3 Encounters:  09/27/23 175 lb 12.8 oz (79.7 kg)  02/15/23 181 lb 6.4 oz (82.3 kg)  10/15/22 193 lb 6.4 oz (87.7 kg)   Constitutional: normal weight, in NAD, very tanned Eyes:  EOMI, no exophthalmos ENT: no neck masses, no cervical lymphadenopathy Cardiovascular: RRR, No MRG Respiratory: CTA B Musculoskeletal: no deformities Skin:no rashes Neurological: no tremor with outstretched hands. Diabetic Foot Exam - Simple   Simple Foot Form Diabetic Foot exam was performed with the following findings: Yes 09/27/2023  3:43 PM  Visual Inspection No deformities, no ulcerations, no other skin breakdown bilaterally: Yes Sensation Testing Intact to touch and monofilament testing bilaterally: Yes Pulse Check Posterior Tibialis and Dorsalis pulse intact bilaterally: Yes Comments    ASSESSMENT: 1. DM2, insulin -dependent, with complications - Peripheral neuropathy  Component     Latest Ref Rng 05/29/2015  Glutamic Acid Decarb Ab     0.0 - 5.0 U/mL <5.0   Component     Latest Ref Rng 06/25/2015  C-Peptide     0.80-3.85 ng/mL 0.87  Glucose, Fasting     65 - 99 mg/dL 409 (H)  Pancreatic Islet Cell Antibody     < 5 JDF Units <5  Vitamin B12     211 - 911  pg/mL 261  Anti-pancreatic antibodies negative. C-peptide low normal. He may have a degree of insulin  deficiency, but this was not clearly evident...  Component     Latest Ref Rng & Units 02/05/2016  Hemoglobin A1C      5.3  C-Peptide     0.80 - 3.85 ng/mL 1.33  Glucose, Fasting     65 - 99 mg/dL 97  Labs again indicated good insulin  production...  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 Low B12  PLAN:  1. Patient with history of uncontrolled, insulin -dependent, type 2 diabetes, with improved control after starting generic Adderall.  He was sleeping better, anxiety and depression improved, and he was also able to improve his diet and exercise.  HbA1c reached a nadir of 5.6%.  At last visit, however, this was higher, but still at goal, at 6.1%.  We did not change his regimen at that time as sugars were improving.  He still had slightly elevated blood sugars increasing around approximately 2 AM and staying slightly elevated until 12 PM when they were starting to decrease.  We did not change his regimen especially as he only switched to Ozempic  2 weeks prior to our last visit. CGM interpretation: -At today's visit, we reviewed his CGM downloads: It appears that 61% of values are in target range (goal >70%), while 39% are higher than 180 (goal <25%), and 0% are lower than 70 (goal <4%).  The calculated average blood sugar is 173.  The projected HbA1c for the next 3 months (GMI) is 7.4%. -Reviewing the CGM trends, sugars appear to be higher than before, particularly increasing overnight, with a peak in the early morning and then improving as the day goes by.  Upon questioning, he was out of Tresiba  for the last 2 weeks as he did not have any more refills and this is likely the reason for his blood sugars being higher.  He continues to exercise, which helps with the sugars during the day.  We discussed about starting  back on Tresiba  at a lower dose and increasing the dose as needed.  He agrees with this plan.  I advised him  to let me know if the sugars remain elevated.  Increasing the Ozempic  is definitely an option. - I suggested to:  Patient Instructions  Please continue:  - Metformin  ER 1000 mg at night  - Jardiance  25 mg daily  - Ozempic  0.5 mg weekly  Restart: - Tresiba  20 units daily and increase by 4 units every 4 days until sugars in am <130  Please return in 4 months.  - we checked his HbA1c: 6.6% (lower than expected from his CGM) - advised to check sugars at different times of the day - 4x a day, rotating check times - advised for yearly eye exams >> he is UTD - will check annual labs today - return to clinic in 6 months  2. Peripheral neuropathy  -His symptoms improved significantly after starting B12 supplementation and also alpha-lipoic acid-now only rarely has numbness and tingling - He continues on the above supplements  3.  Low B12 vitamin -Balance problems improved after starting B12 -now taking 1000 mcg daily -Latest B12 level was normal: Lab Results  Component Value Date   VITAMINB12 580 02/15/2023  - Will continue to keep an eye on this  Component     Latest Ref Rng 09/27/2023  Glucose     65 - 99 mg/dL 914 (H)   BUN     7 - 25 mg/dL 16   Creatinine     7.82 - 1.29 mg/dL 9.56   BUN/Creatinine Ratio     6 - 22 (calc) SEE NOTE:   Sodium     135 - 146 mmol/L 137   Potassium     3.5 - 5.3 mmol/L 4.1   Chloride     98 - 110 mmol/L 101   CO2     20 - 32 mmol/L 27   Calcium     8.6 - 10.3 mg/dL 9.5   Total Protein     6.1 - 8.1 g/dL 7.3   Albumin MSPROF     3.6 - 5.1 g/dL 4.6   Globulin     1.9 - 3.7 g/dL (calc) 2.7   AG Ratio     1.0 - 2.5 (calc) 1.7   Total Bilirubin     0.2 - 1.2 mg/dL 0.6   Alkaline phosphatase (APISO)     36 - 130 U/L 59   AST     10 - 40 U/L 25   ALT     9 - 46 U/L 36   Cholesterol     <200 mg/dL 213   HDL Cholesterol     > OR = 40 mg/dL 54   Triglycerides     <150 mg/dL 086   LDL Cholesterol (Calc)     mg/dL (calc) 86   Total  CHOL/HDL Ratio     <5.0 (calc) 3.0   Non-HDL Cholesterol (Calc)     <130 mg/dL (calc) 578   Creatinine, Urine     20 - 320 mg/dL 469   Microalb, Ur     mg/dL 1.6   MICROALB/CREAT RATIO     <30 mg/g creat 16   Hemoglobin A1C     4.0 - 5.6 % 6.6 !   eGFR     > OR = 60 mL/min/1.18m2 107   Tests are at goal with the exception of a slightly high glucose.  Emilie Harden, MD PhD  [facility]

## 2023-09-27 NOTE — Patient Instructions (Addendum)
 Please continue:  - Metformin  ER 1000 mg at night  - Jardiance  25 mg daily  - Ozempic  0.5 mg weekly  Restart: - Tresiba  20 units daily and increase by 4 units every 4 days until sugars in am <130  Please return in 4 months.

## 2023-09-28 ENCOUNTER — Ambulatory Visit: Payer: Self-pay | Admitting: Internal Medicine

## 2023-09-28 LAB — COMPREHENSIVE METABOLIC PANEL WITH GFR
AG Ratio: 1.7 (calc) (ref 1.0–2.5)
ALT: 36 U/L (ref 9–46)
AST: 25 U/L (ref 10–40)
Albumin: 4.6 g/dL (ref 3.6–5.1)
Alkaline phosphatase (APISO): 59 U/L (ref 36–130)
BUN: 16 mg/dL (ref 7–25)
CO2: 27 mmol/L (ref 20–32)
Calcium: 9.5 mg/dL (ref 8.6–10.3)
Chloride: 101 mmol/L (ref 98–110)
Creat: 0.92 mg/dL (ref 0.60–1.29)
Globulin: 2.7 g/dL (ref 1.9–3.7)
Glucose, Bld: 160 mg/dL — ABNORMAL HIGH (ref 65–99)
Potassium: 4.1 mmol/L (ref 3.5–5.3)
Sodium: 137 mmol/L (ref 135–146)
Total Bilirubin: 0.6 mg/dL (ref 0.2–1.2)
Total Protein: 7.3 g/dL (ref 6.1–8.1)
eGFR: 107 mL/min/{1.73_m2} (ref >=60)

## 2023-09-28 LAB — LIPID PANEL W/REFLEX DIRECT LDL
Cholesterol: 163 mg/dL (ref <200)
HDL: 54 mg/dL (ref >=40)
LDL Cholesterol (Calc): 86 mg/dL
Non-HDL Cholesterol (Calc): 109 mg/dL (ref <130)
Total CHOL/HDL Ratio: 3 (calc) (ref <5.0)
Triglycerides: 130 mg/dL (ref <150)

## 2023-09-28 LAB — MICROALBUMIN / CREATININE URINE RATIO
Creatinine, Urine: 101 mg/dL (ref 20–320)
Microalb Creat Ratio: 16 mg/g{creat} (ref <30)
Microalb, Ur: 1.6 mg/dL

## 2023-09-28 NOTE — Addendum Note (Signed)
 Addended by: Vernon Goodpasture on: 09/28/2023 08:15 AM   Modules accepted: Orders

## 2024-01-17 ENCOUNTER — Encounter: Payer: Self-pay | Admitting: Internal Medicine

## 2024-01-17 DIAGNOSIS — E1142 Type 2 diabetes mellitus with diabetic polyneuropathy: Secondary | ICD-10-CM

## 2024-01-17 MED ORDER — FREESTYLE LIBRE 3 PLUS SENSOR MISC: 1.0000 | 3 refills | Status: AC

## 2024-03-29 ENCOUNTER — Encounter: Payer: Self-pay | Admitting: Internal Medicine

## 2024-03-29 ENCOUNTER — Ambulatory Visit: Admitting: Internal Medicine

## 2024-03-29 VITALS — BP 142/80 | HR 88 | Resp 16 | Ht 73.0 in | Wt 182.6 lb

## 2024-03-29 DIAGNOSIS — Z7984 Long term (current) use of oral hypoglycemic drugs: Secondary | ICD-10-CM | POA: Diagnosis not present

## 2024-03-29 DIAGNOSIS — E538 Deficiency of other specified B group vitamins: Secondary | ICD-10-CM

## 2024-03-29 DIAGNOSIS — Z794 Long term (current) use of insulin: Secondary | ICD-10-CM | POA: Diagnosis not present

## 2024-03-29 DIAGNOSIS — E1165 Type 2 diabetes mellitus with hyperglycemia: Secondary | ICD-10-CM | POA: Diagnosis not present

## 2024-03-29 DIAGNOSIS — Z7985 Long-term (current) use of injectable non-insulin antidiabetic drugs: Secondary | ICD-10-CM

## 2024-03-29 DIAGNOSIS — E1142 Type 2 diabetes mellitus with diabetic polyneuropathy: Secondary | ICD-10-CM | POA: Diagnosis not present

## 2024-03-29 LAB — POCT GLYCOSYLATED HEMOGLOBIN (HGB A1C): Hemoglobin A1C: 6.9 % — AB (ref 4.0–5.6)

## 2024-03-29 MED ORDER — OZEMPIC (1 MG/DOSE) 4 MG/3ML ~~LOC~~ SOPN: 1.0000 mg | PEN_INJECTOR | SUBCUTANEOUS | 3 refills | Status: AC

## 2024-03-29 NOTE — Patient Instructions (Addendum)
 Please continue:  - Metformin  ER 1000 mg at night  - Jardiance  25 mg daily  - Tresiba  30units daily   Increase: - Ozempic  1 mg weekly  Please return in 4-6 months (before 09/26/2024).

## 2024-03-29 NOTE — Addendum Note (Signed)
 Addended by: CLEOTILDE ROLIN RAMAN on: 03/29/2024 08:40 AM   Modules accepted: Orders

## 2024-03-29 NOTE — Progress Notes (Signed)
 Patient ID: Trevione Wert, male   DOB: 03-29-1981, 43 y.o.   MRN: [id number]  HPI: Galileo Colello James) is a 43 y.o.-year-old male, returning for follow-up for DM2, dx in 05/2015, insulin -dependent, uncontrolled, with complications (peripheral neuropathy). Last visit 6 mo ago.  Interim history: No increased urination, blurry vision, nausea, chest pain. Before last visit, he started to go to the gym [employer]).  He was busy lately and was not able to go but plans to restart.    Reviewed history: In 05/2014 >> started to feel poorly, with dizziness, lightheadedness, and chest pressure. Retrospectively, he also had increased thirst, increased urination, increased hunger with unintentional weight loss (40 lbs in 1 year!), blurry vision.  He went to UC >> CBG: HI >> sent to ED: 561. He was hospitalized and then discharged on insulin .  He was in the ED 2x after the initial admission >> weak, blurry vision - 06/02/2015 (Non-ketotic Hgly - 200-300). Then, he was in the ED (High Pt Reg) 06/17/2015 >> same sxs (Non-ketotic Hgly - 200s).   Since then, he remained on a basal/bolus insulin  regimen.   He saw Rojelio Ade for carb counting teaching.  He feels comfortable with this.   We checked him for type 1 diabetes 2x and the investigation was negative.  Reviewed HbA1c levels Lab Results  Component Value Date   HGBA1C 6.6 (A) 09/27/2023   HGBA1C 5.6 10/15/2022   HGBA1C 6.4 (A) 06/07/2022  08/03/2016: HbA1c calculated from the fructosamine is 7.17%.  He is on:  - Metformin  ER 500 mg 2x a day with meals >> 1000 mg at dinner  - Jardiance  25 mg daily   - Trulicity  1.5 >> 3 weekly >> Ozempic  0.5 mg weekly   - Tresiba  36 >> 30 units daily >> off (ran out x2 weeks) >> restarted 30 units daily - Occasionally Novolog  2-4 units for correction; added 6-10 units NovoLog  before dinner >> 4 units NovoLog  prn >> 0-5 units before dinner  >> off  He tried regular Meformin >> bloating, AP  Pt checks  his sugars more than 4 times a day per review of his CGM downloads:  Previously:  Previously:   Lowest sugar was 55 (exercise) >> 50s >> 50s; hypoglycemia awareness in the 80s. Highest sugar was 381 >> ... 243 >> 300.  Glucometer: OneTouch Verio IQ >> AccuChek Guide  No CKD, last BUN/creatinine:  Lab Results  Component Value Date   BUN 16 09/27/2023   CREATININE 0.92 09/27/2023   Lab Results  Component Value Date   MICRALBCREAT 16 09/27/2023   MICRALBCREAT 7 01/26/2019   + HL: Last set of lipids: Lab Results  Component Value Date   CHOL 163 09/27/2023   HDL 54 09/27/2023   LDLCALC 86 09/27/2023   TRIG 130 09/27/2023   CHOLHDL 3.0 09/27/2023  05/08/2019: 190/111/40/130 He is not on a statin.  - last eye exam was 04/2022: No DR reportedly.  Has astigmatism.  -+ Only occasional slight numbness and tingling in his feet.  Last foot exam 09/27/2023.  Previously on Neurontin , then off His symptoms improved after starting on alpha lipoic acid and B12 vitamin.  In 04/2017, we started B12 1000 mcg daily.  Reviewed vitamin B12 levels: Lab Results  Component Value Date   VITAMINB12 580 02/15/2023   VITAMINB12 232 01/25/2022   VITAMINB12 246 01/12/2021   VITAMINB12 616 01/26/2019   VITAMINB12 946 (H) 12/02/2017   VITAMINB12 383 04/14/2017   VITAMINB12 261 06/25/2015  05/08/2019: Vitamin  B12 415  Reviewed his TFTs: Lab Results  Component Value Date   TSH 1.85 02/15/2023  05/08/2019: TSH 2.21  He had Covid19 in 09/2020. He had vertigo, dizziness, nausea, mm aches, HA, fever (103F).  Sugars were worse (300s).  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  His DM control improved. He was dx'ed with ADHD at the beginning of 2024. On Adderall.  ROS: + see HPI  I reviewed pt's medications, allergies, PMH, social hx, family hx, and changes were documented in the history of present illness.  Otherwise, unchanged from my initial visit note.  Past Medical History:  Diagnosis Date   Anxiety    Depression    Diabetes mellitus without complication (HCC) 05/2015   NEW ONSET   Past Surgical History:  Procedure Laterality Date   APPENDECTOMY     Social History   Social History   Marital Status: Single    Spouse Name: N/A   Number of Children: 0   Occupational History    Retail buyer    Social History Main Topics   Smoking status: Former Smoker -- 0.25 packs/day - quit 2017   Smokeless tobacco: Never Used   Alcohol Use: Yes     Comment: occ   Drug Use: No   Sexual Activity: Yes   Current Outpatient Medications on File Prior to Visit  Medication Sig Dispense Refill   Accu-Chek Softclix Lancets lancets Use as instructed 2x a day 100 each 3   amphetamine-dextroamphetamine (ADDERALL XR) 20 MG 24 hr capsule Take 20 mg by mouth daily.     Continuous Glucose Sensor (FREESTYLE LIBRE 3 PLUS SENSOR) MISC Inject 1 Device into the skin continuous. Change every 15 days 6 each 3   empagliflozin  (JARDIANCE ) 25 MG TABS tablet Take 1 tablet (25 mg total) by mouth daily. 90 tablet 3   glucose blood (ACCU-CHEK GUIDE) test strip Use as instructed 2x a day 100 each 3   insulin  degludec (TRESIBA  FLEXTOUCH) 200 UNIT/ML FlexTouch Pen ADMINISTER 30-36 UNITS UNDER THE SKIN DAILY 18 mL 3   Insulin  Pen Needle (BD PEN NEEDLE NANO 2ND GEN) 32G X 4 MM MISC USE AS DIRECTED TWICE DAILY 100 each 2   Insulin  Pen Needle 31G X 6 MM MISC 1 Device by Does not apply route 2 (two) times daily. 60 each 3   metFORMIN  (GLUCOPHAGE -XR) 500 MG 24 hr tablet TAKE 2 TABLETS BY MOUTH DAILY 180 tablet 3   NOVOLOG  FLEXPEN 100 UNIT/ML FlexPen ADMINISTER 0 TO 6 UNITS UNDER THE SKIN THREE TIMES DAILY BEFORE MEALS. MAKE AN APPOINTMENT 15 mL 1   Semaglutide ,0.25 or 0.5MG /DOS, (OZEMPIC , 0.25 OR 0.5 MG/DOSE,) 2 MG/1.5ML SOPN Inject 0.5 mg into the skin once a week. 9 mL 3   No current facility-administered medications on  file prior to visit.   No Known Allergies   FH: - Maternal aunt with diabetes type 2, insulin -dependent - No known family history of autoimmune diseases.  PE: BP (!) 142/80   Pulse 88   Resp 16   Ht 6' 1 (1.854 m)   Wt 182 lb 9.6 oz (82.8 kg)   SpO2 98%   BMI 24.09 kg/m   Wt Readings from Last 3 Encounters:  03/29/24 182 lb 9.6 oz (82.8 kg)  09/27/23 175 lb 12.8 oz (79.7 kg)  02/15/23 181 lb 6.4 oz (82.3 kg)   Constitutional: normal weight, in NAD Eyes:  EOMI,  no exophthalmos ENT: no neck masses, no cervical lymphadenopathy Cardiovascular: RRR, No MRG Respiratory: CTA B Musculoskeletal: no deformities Skin:no rashes Neurological: no tremor with outstretched hands.  ASSESSMENT: 1. DM2, insulin -dependent, with complications - Peripheral neuropathy  Component     Latest Ref Rng 05/29/2015  Glutamic Acid Decarb Ab     0.0 - 5.0 U/mL <5.0   Component     Latest Ref Rng 06/25/2015  C-Peptide     0.80-3.85 ng/mL 0.87  Glucose, Fasting     65 - 99 mg/dL 838 (H)  Pancreatic Islet Cell Antibody     < 5 JDF Units <5  Vitamin B12     211 - 911 pg/mL 261  Anti-pancreatic antibodies negative. C-peptide low normal. He may have a degree of insulin  deficiency, but this was not clearly evident...  Component     Latest Ref Rng & Units 02/05/2016  Hemoglobin A1C      5.3  C-Peptide     0.80 - 3.85 ng/mL 1.33  Glucose, Fasting     65 - 99 mg/dL 97  Labs again indicated good insulin  production...  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 Low B12  PLAN:  1. Patient with history of uncontrolled, insulin -dependent, type 2 diabetes, with improved control after starting generic Adderall.  He started sleeping better, anxiety and depression improved, and he was also able to improve his diet and exercise.  HbA1c reached a nadir of 5.6%.  However, afterwards, sugars worsened, and at last visit, they were increasing overnight, with a peak in the early morning and improving as the day went by.  Upon  questioning, he was out of Tresiba  for 2 weeks as he did not have any more refills.  We restarted this at a lower dose and I advised him to increase it as tolerated.  I did advise him to let me know if sugars remain elevated to also increase the Ozempic  dose if needed.  HbA1c at last visit was 6.6%, but this was lower than expected from his CGM for the previous 2 weeks, for which the GMI was 7.4%. CGM interpretation: -At today's visit, we reviewed his CGM downloads: It appears that 63% of values are in target range (goal >70%), while 37% are higher than 180 (goal <25%), and 0% are lower than 70 (goal <4%).  The calculated average blood sugar is 166.  The projected HbA1c for the next 3 months (GMI) is 7.3%. -Reviewing the CGM trends, sugars appear to be fluctuating around the upper limit of the target range, with hyperglycemic exceptions after lunch and dinner.  At today's visit we discussed about options for treatment and we decided to increase the dose of Ozempic .  I did advise him that if he starts losing too much weight, to let me know, in which case, we will need to back off the dose.  We are trying to avoid the need to restart NovoLog . - I suggested to:  Patient Instructions  Please continue:  - Metformin  ER 1000 mg at night  - Jardiance  25 mg daily  - Tresiba  30units daily   Increase: - Ozempic  1 mg weekly  Please return in 4-6 months (before 09/26/2024).  - we checked his HbA1c: 6.9% (higher than previously, but lower than expected from his CGM) - advised to check sugars at different times of the day - 4x a day, rotating check times - advised for yearly eye exams >> he is not UTD - return to clinic in 4-6 months  2.  Peripheral neuropathy  -His symptoms improved significantly after starting B12 supplementation and also alpha-lipoic acid-now only rarely has numbness and tingling - He continues on the above supplements  3.  Low B12 vitamin - He has balance problems improved after  starting B12, currently on 1000 mcg daily - Latest B12 level was normal: Lab Results  Component Value Date   VITAMINB12 580 02/15/2023  - Will continue to keep an eye on this -will recheck this with the next set of labs  Lela Fendt, MD PhD [facility]

## 2024-04-10 ENCOUNTER — Other Ambulatory Visit (HOSPITAL_COMMUNITY): Payer: Self-pay

## 2024-04-10 ENCOUNTER — Telehealth: Payer: Self-pay | Admitting: Pharmacy Technician

## 2024-04-10 NOTE — Telephone Encounter (Signed)
 Pharmacy Patient Advocate Encounter   Received notification from Onbase that prior authorization for Ozempic  (1 MG/DOSE) 4MG /3ML pen-injectors  is required/requested.   Insurance verification completed.   The patient is insured through [facility].   Per test claim: PA required; PA started via CoverMyMeds. KEY [id number] . Waiting for clinical questions to populate.

## 2024-04-11 ENCOUNTER — Other Ambulatory Visit (HOSPITAL_COMMUNITY): Payer: Self-pay

## 2024-04-11 NOTE — Telephone Encounter (Signed)
 Pharmacy Patient Advocate Encounter  Received notification from [facility] that Prior Authorization for Ozempic  (1 MG/DOSE) 4MG /3ML pen-injectors  has been CANCELLED due to REFILL TOO SOON. No PA needed.

## 2024-10-03 ENCOUNTER — Ambulatory Visit: Admitting: Internal Medicine
